# Patient Record
Sex: Female | Born: 1970 | Race: Black or African American | Hispanic: No | Smoking: Never smoker
Health system: Southern US, Community
[De-identification: ages and names within clinical notes are randomized; demographics above are authoritative.]

## PROBLEM LIST (undated history)

## (undated) ENCOUNTER — Ambulatory Visit: Admission: EM | Payer: Self-pay | Source: Home / Self Care

## (undated) DIAGNOSIS — M419 Scoliosis, unspecified: Secondary | ICD-10-CM

## (undated) DIAGNOSIS — I10 Essential (primary) hypertension: Secondary | ICD-10-CM

## (undated) DIAGNOSIS — R011 Cardiac murmur, unspecified: Secondary | ICD-10-CM

## (undated) HISTORY — DX: Scoliosis, unspecified: M41.9

## (undated) HISTORY — PX: OTHER SURGICAL HISTORY: SHX169

## (undated) HISTORY — PX: DILATION AND CURETTAGE OF UTERUS: SHX78

## (undated) HISTORY — DX: Essential (primary) hypertension: I10

## (undated) HISTORY — DX: Cardiac murmur, unspecified: R01.1

---

## 1997-10-07 ENCOUNTER — Other Ambulatory Visit: Admission: RE | Admit: 1997-10-07 | Discharge: 1997-10-07 | Payer: Self-pay | Admitting: Obstetrics & Gynecology

## 1998-04-29 ENCOUNTER — Inpatient Hospital Stay (HOSPITAL_COMMUNITY): Admission: AD | Admit: 1998-04-29 | Discharge: 1998-04-29 | Payer: Self-pay | Admitting: Obstetrics & Gynecology

## 1998-04-29 ENCOUNTER — Encounter: Payer: Self-pay | Admitting: Obstetrics and Gynecology

## 1998-04-30 ENCOUNTER — Inpatient Hospital Stay (HOSPITAL_COMMUNITY): Admission: AD | Admit: 1998-04-30 | Discharge: 1998-05-03 | Payer: Self-pay | Admitting: Obstetrics and Gynecology

## 1998-10-16 ENCOUNTER — Other Ambulatory Visit: Admission: RE | Admit: 1998-10-16 | Discharge: 1998-10-16 | Payer: Self-pay | Admitting: Obstetrics and Gynecology

## 2001-06-29 ENCOUNTER — Emergency Department (HOSPITAL_COMMUNITY): Admission: EM | Admit: 2001-06-29 | Discharge: 2001-06-29 | Payer: Self-pay | Admitting: Emergency Medicine

## 2002-06-22 ENCOUNTER — Emergency Department (HOSPITAL_COMMUNITY): Admission: EM | Admit: 2002-06-22 | Discharge: 2002-06-22 | Payer: Self-pay | Admitting: Emergency Medicine

## 2002-06-22 ENCOUNTER — Encounter: Payer: Self-pay | Admitting: Emergency Medicine

## 2002-10-21 ENCOUNTER — Other Ambulatory Visit: Admission: RE | Admit: 2002-10-21 | Discharge: 2002-10-21 | Payer: Self-pay | Admitting: Obstetrics and Gynecology

## 2003-07-23 ENCOUNTER — Emergency Department (HOSPITAL_COMMUNITY): Admission: EM | Admit: 2003-07-23 | Discharge: 2003-07-24 | Payer: Self-pay | Admitting: Emergency Medicine

## 2004-01-30 ENCOUNTER — Other Ambulatory Visit: Admission: RE | Admit: 2004-01-30 | Discharge: 2004-01-30 | Payer: Self-pay | Admitting: Obstetrics and Gynecology

## 2004-06-01 ENCOUNTER — Encounter: Admission: RE | Admit: 2004-06-01 | Discharge: 2004-06-01 | Payer: Self-pay | Admitting: Family Medicine

## 2005-03-29 ENCOUNTER — Other Ambulatory Visit: Admission: RE | Admit: 2005-03-29 | Discharge: 2005-03-29 | Payer: Self-pay | Admitting: *Deleted

## 2005-04-18 ENCOUNTER — Encounter: Admission: RE | Admit: 2005-04-18 | Discharge: 2005-05-03 | Payer: Self-pay | Admitting: Family Medicine

## 2005-06-13 ENCOUNTER — Emergency Department (HOSPITAL_COMMUNITY): Admission: EM | Admit: 2005-06-13 | Discharge: 2005-06-13 | Payer: Self-pay | Admitting: Internal Medicine

## 2007-04-10 ENCOUNTER — Other Ambulatory Visit: Admission: RE | Admit: 2007-04-10 | Discharge: 2007-04-10 | Payer: Self-pay | Admitting: Family Medicine

## 2008-03-27 ENCOUNTER — Emergency Department (HOSPITAL_COMMUNITY): Admission: EM | Admit: 2008-03-27 | Discharge: 2008-03-27 | Payer: Self-pay | Admitting: Emergency Medicine

## 2010-01-02 ENCOUNTER — Inpatient Hospital Stay (HOSPITAL_COMMUNITY)
Admission: AD | Admit: 2010-01-02 | Discharge: 2010-01-03 | Payer: Self-pay | Source: Home / Self Care | Attending: Obstetrics and Gynecology | Admitting: Obstetrics and Gynecology

## 2010-01-06 ENCOUNTER — Ambulatory Visit (HOSPITAL_COMMUNITY)
Admission: AD | Admit: 2010-01-06 | Discharge: 2010-01-06 | Payer: Self-pay | Source: Home / Self Care | Attending: Obstetrics and Gynecology | Admitting: Obstetrics and Gynecology

## 2010-02-14 ENCOUNTER — Encounter: Payer: Self-pay | Admitting: Family Medicine

## 2010-03-31 IMAGING — CR DG CHEST 2V
2 series · 2 of 2 positions shown · non-contrast
Comparison: Report from 06/22/2002

CLINICAL DATA: Chest pain.  Shortness of breath.

CHEST - 2 VIEW

[w chest pa]
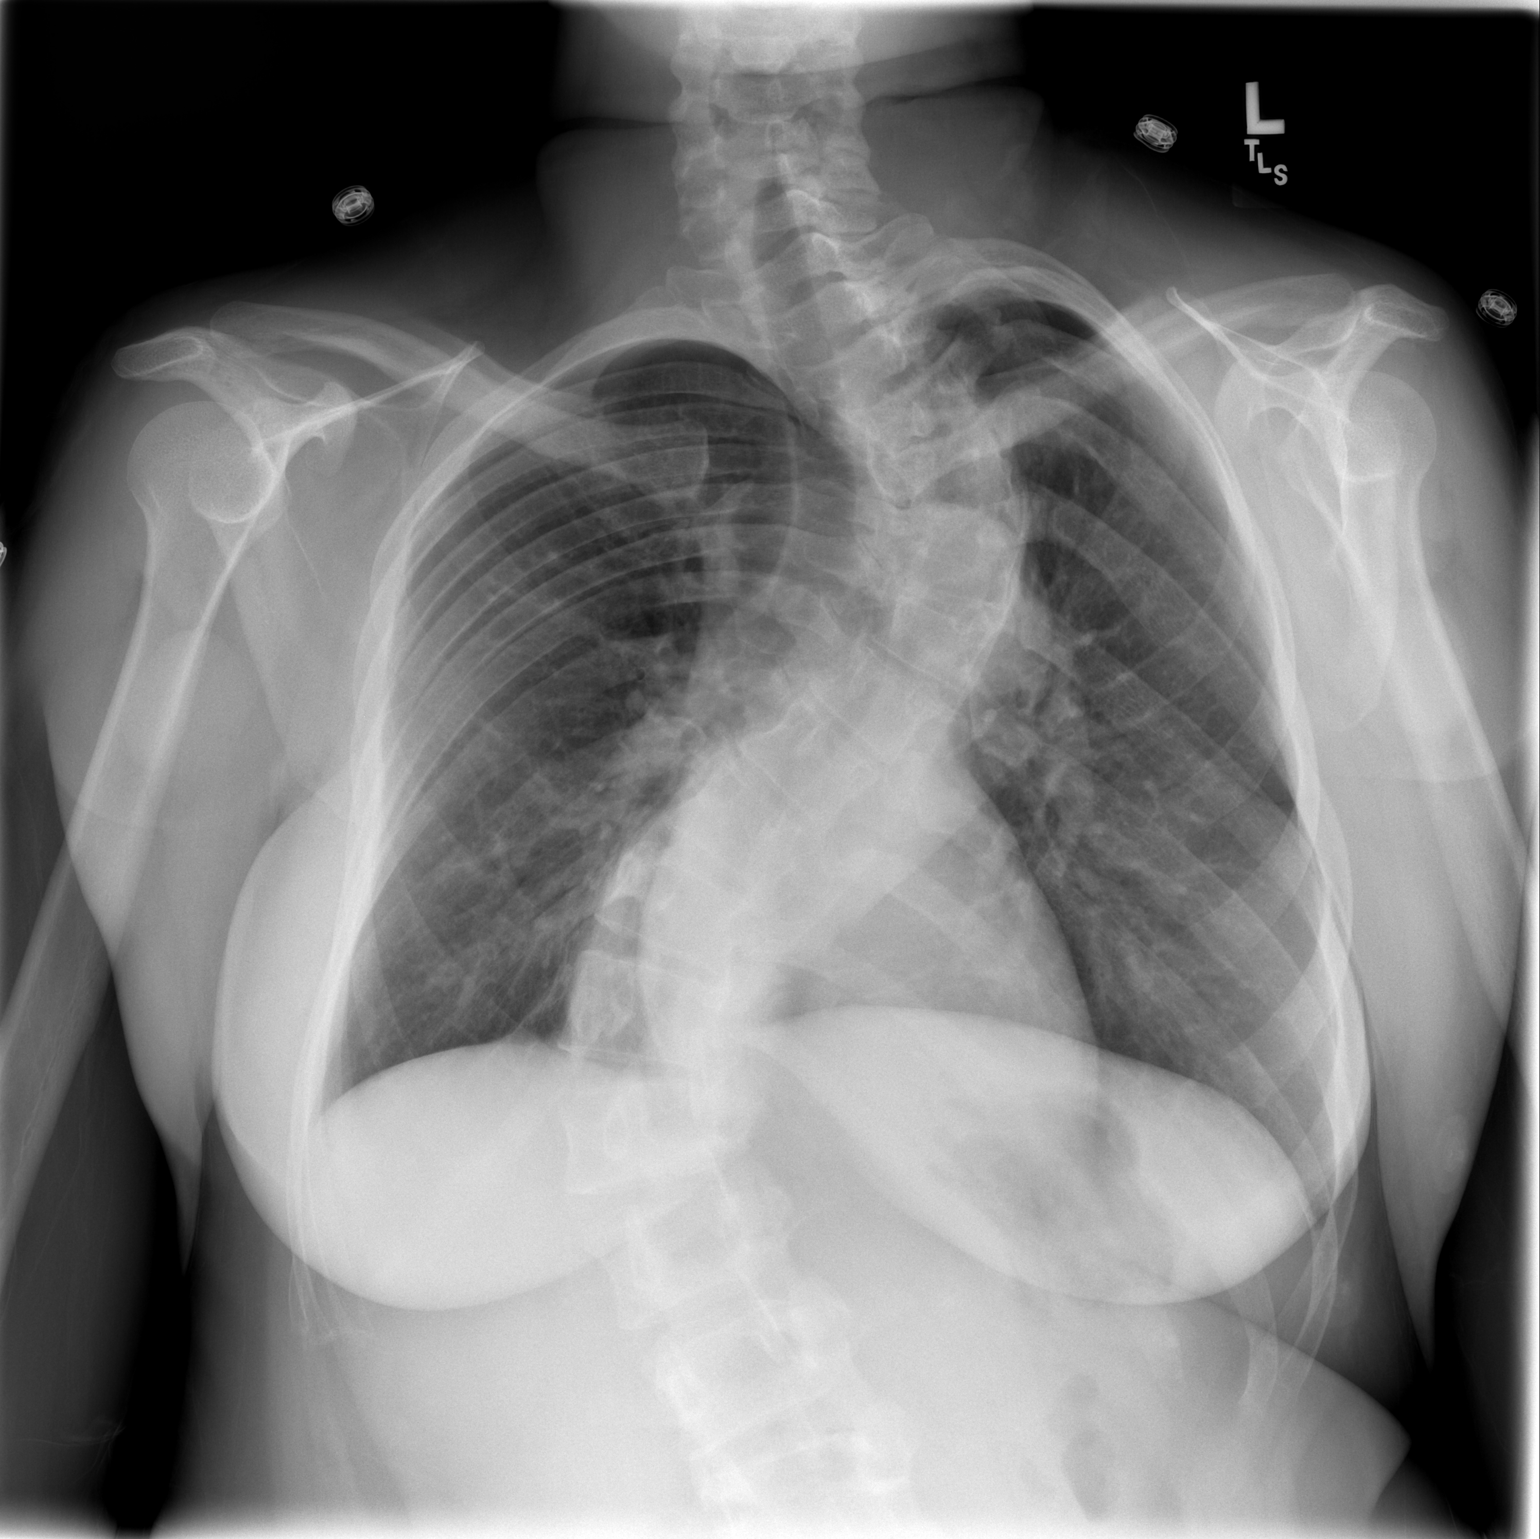

[w chest lat]
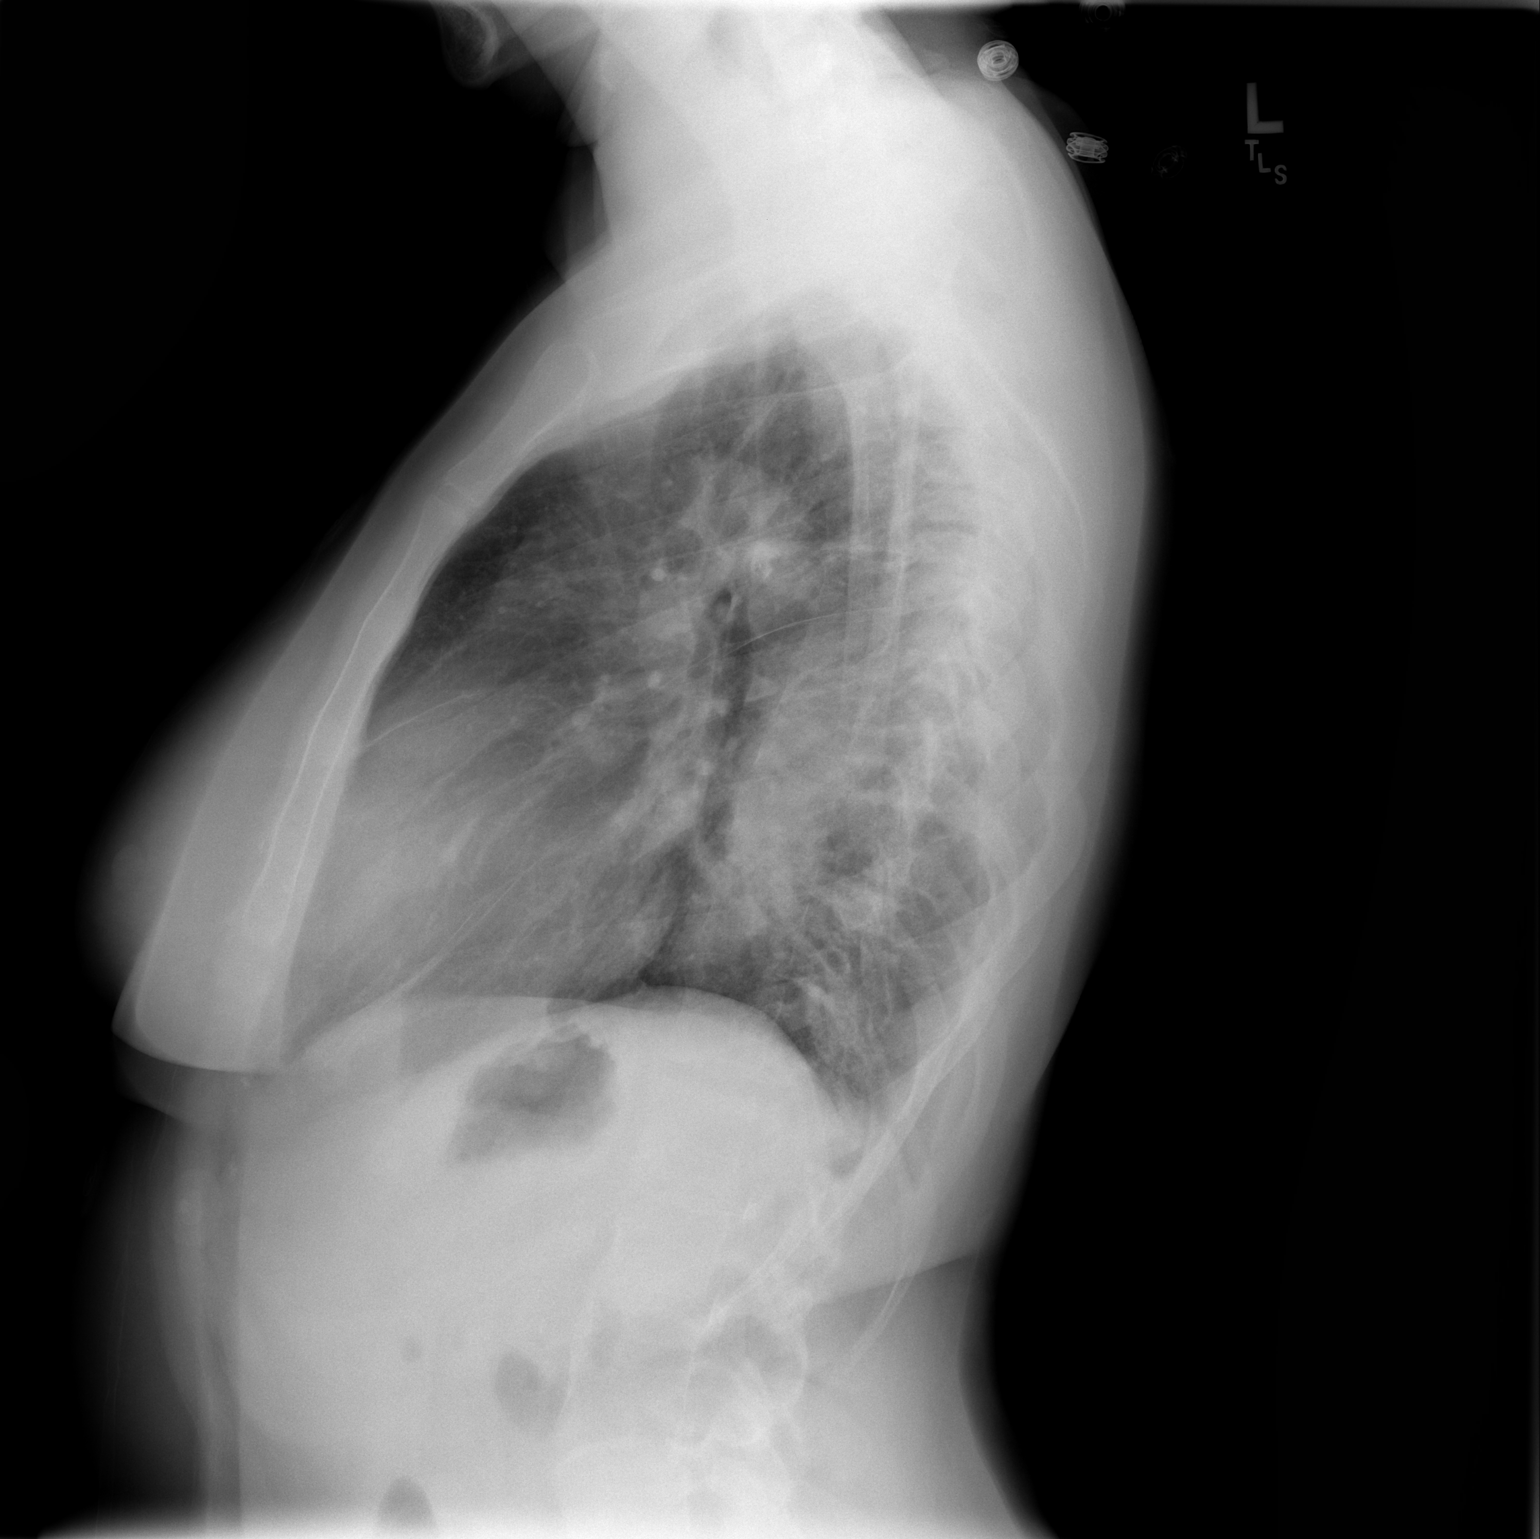

[2 of 2 positions shown; findings below may reference images not displayed]

FINDINGS: There is 74 degrees of levoconvex scoliosis as measured
between the inferior endplate of T3 and the superior endplate of
T9.  There is 65 degrees of dextroconvex thoracic scoliosis between
the inferior endplate of T9 and the superior endplate of L3.  In
the upper thoracic curve there may be associated vertebral anomaly
such as hemivertebra.

On the frontal projection, a double contour along the descending
thoracic aorta is noted.  This could well be related to the
underlying scoliotic deformity, but I cannot exclude atelectasis
medially in the left lower lobe as a potential cause.  The
appearance is more sharply defined than is typically encountered in
the setting of pneumonia.  This could be further evaluated with CT
the chest if clinically warranted.
IMPRESSION: 1.  Prominent S-shaped thoracolumbar scoliosis as noted above.
2.  Double contour along the aortic margin could possibly represent
atelectasis medially in the left lower lobe, but may simply be
related to rotation and the underlying severe scoliosis.  Correlate
with breath sounds and consider CT if clinically warranted.

## 2010-04-06 LAB — URINALYSIS, ROUTINE W REFLEX MICROSCOPIC
Bilirubin Urine: NEGATIVE
Glucose, UA: NEGATIVE mg/dL
Leukocytes, UA: NEGATIVE
Specific Gravity, Urine: >1.03 — ABNORMAL HIGH (ref 1.005–1.030)
pH: 5 (ref 5.0–8.0)

## 2010-04-06 LAB — CBC
HCT: 35.1 % — ABNORMAL LOW (ref 36.0–46.0)
Hemoglobin: 11.8 g/dL — ABNORMAL LOW (ref 12.0–15.0)
RDW: 14.7 % (ref 11.5–15.5)
WBC: 7.4 10*3/uL (ref 4.0–10.5)

## 2010-04-06 LAB — URINE MICROSCOPIC-ADD ON

## 2010-05-06 LAB — CBC
HCT: 33.9 % — ABNORMAL LOW (ref 36.0–46.0)
MCHC: 34.4 g/dL (ref 30.0–36.0)
MCV: 83.1 fL (ref 78.0–100.0)
RBC: 4.08 MIL/uL (ref 3.87–5.11)

## 2010-05-06 LAB — POCT I-STAT, CHEM 8
Creatinine, Ser: 0.8 mg/dL (ref 0.4–1.2)
HCT: 36 % (ref 36.0–46.0)
Hemoglobin: 12.2 g/dL (ref 12.0–15.0)
Potassium: 3.8 mEq/L (ref 3.5–5.1)

## 2010-05-06 LAB — DIFFERENTIAL
Basophils Absolute: 0 10*3/uL (ref 0.0–0.1)
Eosinophils Absolute: 0.1 10*3/uL (ref 0.0–0.7)
Lymphocytes Relative: 29 % (ref 12–46)
Lymphs Abs: 1.5 10*3/uL (ref 0.7–4.0)
Monocytes Absolute: 0.3 10*3/uL (ref 0.1–1.0)
Monocytes Relative: 5 % (ref 3–12)
Neutrophils Relative %: 65 % (ref 43–77)

## 2010-05-06 LAB — D-DIMER, QUANTITATIVE: D-Dimer, Quant: 0.33 ug/mL-FEU (ref 0.00–0.48)

## 2010-05-06 LAB — POCT CARDIAC MARKERS: CKMB, poc: <1 ng/mL — ABNORMAL LOW (ref 1.0–8.0)

## 2011-03-01 ENCOUNTER — Other Ambulatory Visit (HOSPITAL_COMMUNITY)
Admission: RE | Admit: 2011-03-01 | Discharge: 2011-03-01 | Disposition: A | Discharge status: Home / Self Care | Payer: Self-pay | Source: Ambulatory Visit | Attending: Family Medicine | Admitting: Family Medicine

## 2011-03-01 ENCOUNTER — Other Ambulatory Visit: Payer: Self-pay | Admitting: Family Medicine

## 2011-03-01 DIAGNOSIS — Z113 Encounter for screening for infections with a predominantly sexual mode of transmission: Secondary | ICD-10-CM | POA: Insufficient documentation

## 2011-03-01 DIAGNOSIS — Z124 Encounter for screening for malignant neoplasm of cervix: Secondary | ICD-10-CM | POA: Insufficient documentation

## 2011-05-01 ENCOUNTER — Emergency Department (HOSPITAL_COMMUNITY): Payer: Self-pay

## 2011-05-01 ENCOUNTER — Encounter (HOSPITAL_COMMUNITY): Payer: Self-pay | Admitting: *Deleted

## 2011-05-01 ENCOUNTER — Emergency Department (HOSPITAL_COMMUNITY)
Admission: EM | Admit: 2011-05-01 | Discharge: 2011-05-01 | Disposition: A | Discharge status: Home / Self Care | Payer: Self-pay | Attending: Emergency Medicine | Admitting: Emergency Medicine

## 2011-05-01 DIAGNOSIS — J069 Acute upper respiratory infection, unspecified: Secondary | ICD-10-CM | POA: Insufficient documentation

## 2011-05-01 DIAGNOSIS — R05 Cough: Secondary | ICD-10-CM | POA: Insufficient documentation

## 2011-05-01 DIAGNOSIS — H9209 Otalgia, unspecified ear: Secondary | ICD-10-CM | POA: Insufficient documentation

## 2011-05-01 DIAGNOSIS — J3489 Other specified disorders of nose and nasal sinuses: Secondary | ICD-10-CM | POA: Insufficient documentation

## 2011-05-01 DIAGNOSIS — IMO0001 Reserved for inherently not codable concepts without codable children: Secondary | ICD-10-CM | POA: Insufficient documentation

## 2011-05-01 DIAGNOSIS — R509 Fever, unspecified: Secondary | ICD-10-CM | POA: Insufficient documentation

## 2011-05-01 DIAGNOSIS — R059 Cough, unspecified: Secondary | ICD-10-CM | POA: Insufficient documentation

## 2011-05-01 MED ORDER — GUAIFENESIN ER 1200 MG PO TB12: 1.0000 | ORAL_TABLET | Freq: Two times a day (BID) | ORAL | Status: DC

## 2011-05-01 MED ORDER — PROMETHAZINE-DM 6.25-15 MG/5ML PO SYRP: 5.0000 mL | ORAL_SOLUTION | Freq: Four times a day (QID) | ORAL | Status: AC | PRN

## 2011-05-01 MED ORDER — IBUPROFEN 800 MG PO TABS: 800.0000 mg | ORAL_TABLET | Freq: Three times a day (TID) | ORAL | Status: AC | PRN

## 2011-05-01 MED ORDER — ONDANSETRON 8 MG PO TBDP
8.0000 mg | ORAL_TABLET | Freq: Once | ORAL | Status: AC
  Administered 2011-05-01: 8 mg via ORAL
  Filled 2011-05-01: qty 1

## 2011-05-01 MED ORDER — IBUPROFEN 800 MG PO TABS
800.0000 mg | ORAL_TABLET | Freq: Once | ORAL | Status: AC
  Administered 2011-05-01: 800 mg via ORAL
  Filled 2011-05-01: qty 1

## 2011-05-01 NOTE — ED Notes (Signed)
Pt reports sick since Monday with similar symptoms to what her daughter had. Pt has fever, chills, ear pain and shortness of breath.  Pt also reports non-productive cough.  Pt denies having flu shot this year.  Pt reports fevers of up to 104.  Pt denies history of seasonal allergies or asthma.

## 2011-05-01 NOTE — Discharge Instructions (Signed)
Increase your fluids. Return here as needed. Rest as much as possible.

## 2011-05-01 NOTE — ED Provider Notes (Signed)
History     CSN: [id number]  Arrival date & time 05/01/11  1247   First MD Initiated Contact with Patient 05/01/11 1458      Chief Complaint  Patient presents with  . Cough  . Otalgia  . Shortness of Breath  . Facial Pain    (Consider location/radiation/quality/duration/timing/severity/associated sxs/prior treatment) HPI Patient presents emergency Dept. with a one-week history of upper respiratory symptoms.  She, states she has had fever, chills, ear pain, cough, and nasal congestion.  She denies chest pain, abdominal pain, headache, visual changes, stiff neck, back, pain, or urinary symptoms.  She, states she has tried over-the-counter Coricidin and ibuprofen with minimal relief.  She also, states she has had body aches as well.  She states, that she has been resting and increasing her fluid intake. History reviewed. No pertinent past medical history.  Past Surgical History  Procedure Date  . Cesarean section     No family history on file.  History  Substance Use Topics  . Smoking status: Never Smoker   . Smokeless tobacco: Not on file  . Alcohol Use: No    OB History    Grav Para Term Preterm Abortions TAB SAB Ect Mult Living                  Review of Systems All pertinent positives and negatives reviewed in the history of present illness  Allergies  Review of patient's allergies indicates no known allergies.  Home Medications   Current Outpatient Rx  Name Route Sig Dispense Refill  . ACETAMINOPHEN 500 MG PO TABS Oral Take 500 mg by mouth every 6 (six) hours as needed. For pain    . VICKS VAPORUB 4.73-1.2-2.6 % EX OINT Apply externally Apply 1 application topically as needed. For cold and flu    . CHLORPHENIRAMINE-ACETAMINOPHEN 2-325 MG PO TABS Oral Take 1 tablet by mouth every 6 (six) hours as needed. For cold and flu    . IBUPROFEN 200 MG PO TABS Oral Take 400 mg by mouth every 6 (six) hours as needed. For pain    . PSEUDOEPH-DOXYLAMINE-DM-APAP  60-12.06-22-998 MG/30ML PO LIQD Oral Take 15 mLs by mouth every 6 (six) hours as needed. For cold and flu    . GUAIFENESIN ER 1200 MG PO TB12 Oral Take 1 tablet (1,200 mg total) by mouth 2 (two) times daily. 20 each 0  . IBUPROFEN 800 MG PO TABS Oral Take 1 tablet (800 mg total) by mouth every 8 (eight) hours as needed for pain. 21 tablet 0  . PROMETHAZINE-DM 6.25-15 MG/5ML PO SYRP Oral Take 5-10 mLs by mouth 4 (four) times daily as needed for cough. 120 mL 0    BP 118/81  Pulse 88  Temp(Src) 98.1 F (36.7 C) (Axillary)  Resp 16  SpO2 100%  LMP 04/29/2011  Physical Exam  Constitutional: She is oriented to person, place, and time. She appears well-developed and well-nourished. No distress.  HENT:  Head: Normocephalic and atraumatic.  Mouth/Throat: Oropharynx is clear and moist. No oropharyngeal exudate.  Eyes: Conjunctivae are normal. Pupils are equal, round, and reactive to light.  Neck: Normal range of motion. Neck supple. No rigidity. Normal range of motion present.  Cardiovascular: Normal rate, regular rhythm and normal heart sounds.  Exam reveals no gallop and no friction rub.   No murmur heard. Pulmonary/Chest: Effort normal and breath sounds normal. No respiratory distress. She has no wheezes. She has no rales.  Lymphadenopathy:    She has no cervical  adenopathy.  Neurological: She is alert and oriented to person, place, and time.  Skin: Skin is warm and dry. No rash noted.    ED Course  Procedures (including critical care time)  Labs Reviewed - No data to display Dg Chest 2 View  05/01/2011  *RADIOLOGY REPORT*  Clinical Data: Fever and cough.  Mild SOB  CHEST - 2 VIEW  Comparison: 03/27/2008  Findings: The heart size and mediastinal contours are within normal limits.  Both lungs are clear. There is a prominent scoliosis deformity involving the thoracic and lumbar spine.  IMPRESSION: Negative exam.  Original Report Authenticated By: Rosealee Albee, M.D.     1. Viral URI  with cough    Patient has a viral URI  Based on her HPI  and physical exam findings, along with her x-ray results.  Patient is not showing any signs of respiratory distress.  Emergency room.  Her pulse oximetry is normal.  Patient advised increased fluid intake and rest as much possible.  She still to return here for any worsening in her condition.    MDM  See above        Carlyle Dolly, PA-C 05/01/11 2051

## 2011-05-02 NOTE — ED Provider Notes (Signed)
Medical screening examination/treatment/procedure(s) were performed by non-physician practitioner and as supervising physician I was immediately available for consultation/collaboration.  Doug Sou, MD 05/02/11 [phone]

## 2011-09-29 ENCOUNTER — Emergency Department (HOSPITAL_BASED_OUTPATIENT_CLINIC_OR_DEPARTMENT_OTHER)
Admission: EM | Admit: 2011-09-29 | Discharge: 2011-09-30 | Disposition: A | Discharge status: Home / Self Care | Payer: Self-pay | Attending: Emergency Medicine | Admitting: Emergency Medicine

## 2011-09-29 ENCOUNTER — Encounter (HOSPITAL_BASED_OUTPATIENT_CLINIC_OR_DEPARTMENT_OTHER): Payer: Self-pay | Admitting: *Deleted

## 2011-09-29 ENCOUNTER — Emergency Department (HOSPITAL_BASED_OUTPATIENT_CLINIC_OR_DEPARTMENT_OTHER): Payer: Self-pay

## 2011-09-29 DIAGNOSIS — M25469 Effusion, unspecified knee: Secondary | ICD-10-CM | POA: Insufficient documentation

## 2011-09-29 DIAGNOSIS — M25461 Effusion, right knee: Secondary | ICD-10-CM

## 2011-09-29 MED ORDER — OXYCODONE-ACETAMINOPHEN 5-325 MG PO TABS
2.0000 | ORAL_TABLET | Freq: Once | ORAL | Status: AC
  Administered 2011-09-30: 2 via ORAL
  Filled 2011-09-29 (×2): qty 2

## 2011-09-29 NOTE — ED Notes (Signed)
C/o right knee pain, swelling, and giving out on her for 2 months. Pt denies injury.

## 2011-09-29 NOTE — ED Provider Notes (Signed)
History     CSN: [id number]  Arrival date & time 09/29/11  2248   First MD Initiated Contact with Patient 09/29/11 2337      Chief Complaint  Patient presents with  . Knee Pain    (Consider location/radiation/quality/duration/timing/severity/associated sxs/prior treatment) HPI Pain and swelling right knee for two months. Worse now for several days.  No known injury.  Patient is hairdresser and on feet all day.  She has not had direct trauma, redness, or fever.  No history of gout.  Patient has not had similar problems in past.  She is using ibuprofen and heat and ice at home without relief.  History reviewed. No pertinent past medical history.  Past Surgical History  Procedure Date  . Cesarean section     History reviewed. No pertinent family history.  History  Substance Use Topics  . Smoking status: Never Smoker   . Smokeless tobacco: Not on file  . Alcohol Use: No    OB History    Grav Para Term Preterm Abortions TAB SAB Ect Mult Living                  Review of Systems  Constitutional: Negative for fever, chills, activity change, appetite change and unexpected weight change.  HENT: Negative for sore throat, rhinorrhea, neck pain, neck stiffness and sinus pressure.   Eyes: Negative for visual disturbance.  Respiratory: Negative for cough and shortness of breath.   Cardiovascular: Negative for chest pain and leg swelling.  Gastrointestinal: Negative for vomiting, abdominal pain, diarrhea and blood in stool.  Genitourinary: Negative for dysuria, urgency, frequency, vaginal discharge and difficulty urinating.  Musculoskeletal: Positive for joint swelling and arthralgias. Negative for myalgias and gait problem.  Skin: Negative for color change and rash.  Neurological: Negative for weakness, light-headedness and headaches.  Hematological: Does not bruise/bleed easily.  Psychiatric/Behavioral: Negative for dysphoric mood.    Allergies  Review of patient's allergies  indicates no known allergies.  Home Medications   Current Outpatient Rx  Name Route Sig Dispense Refill  . IBUPROFEN 200 MG PO TABS Oral Take 800 mg by mouth every 6 (six) hours as needed. For pain      BP 131/77  Pulse 78  Temp 98.5 F (36.9 C) (Oral)  Resp 14  Ht 5\' 7"  (1.702 m)  Wt 171 lb (77.565 kg)  BMI 26.78 kg/m2  SpO2 100%  Physical Exam  Nursing note and vitals reviewed. Constitutional: She appears well-developed and well-nourished.  HENT:  Head: Normocephalic.  Eyes: Pupils are equal, round, and reactive to light.  Neck: Normal range of motion.  Cardiovascular: Normal rate and regular rhythm.   Pulmonary/Chest: Effort normal.  Musculoskeletal:       Right knee with swelling and tenderness to right knee diffusely with effusion.  DP right foot intact with sensation intact.  No erythema or warmth.   Neurological: She is alert.  Skin: Skin is warm and dry.  Psychiatric: She has a normal mood and affect.    ED Course  ARTHOCENTESIS Date/Time: 09/30/2011 12:39 AM Performed by: Hilario Quarry Authorized by: Hilario Quarry Consent: Verbal consent obtained. Written consent obtained. Risks and benefits: risks, benefits and alternatives were discussed Consent given by: patient Patient identity confirmed: verbally with patient and arm band Time out: Immediately prior to procedure a "time out" was called to verify the correct patient, procedure, equipment, support staff and site/side marked as required. Indications: joint swelling and pain  Body area: knee Joint: right  knee Local anesthesia used: yes Anesthesia: local infiltration Local anesthetic: lidocaine 1% with epinephrine Anesthetic total: 5 ml Patient sedated: no Preparation: Patient was prepped and draped in the usual sterile fashion. Needle gauge: 22 G Approach: lateral Aspirate: blood-tinged and serous Aspirate amount: 35 ml Patient tolerance: Patient tolerated the procedure well with no immediate  complications.   (including critical care time)  Labs Reviewed - No data to display No results found.   No diagnosis found.  Dg Knee Complete 4 Views Right  09/29/2011  *RADIOLOGY REPORT*  Clinical Data: Right knee pain and swelling for weeks with difficulty bearing weight.  No known injury.  RIGHT KNEE - COMPLETE 4+ VIEW  Comparison: None.  Findings: There is a moderate sized knee joint effusion.  The mineralization and alignment are normal.  There is no evidence of acute fracture, dislocation or significant joint space loss.  No erosive changes are seen.  IMPRESSION: Moderate sized knee joint effusion.  No acute osseous or significant arthropathic changes.   Original Report Authenticated By: Gerrianne Scale, M.D.     Patient to have knee immobilizer placed and crutches.  Instructed to ice and elevate.  Patient referred to follow up with Dr. Pearletha Forge.       Hilario Quarry, MD 09/30/11 [phone]

## 2011-09-30 MED ORDER — OXYCODONE-ACETAMINOPHEN 5-325 MG PO TABS: 1.0000 | ORAL_TABLET | ORAL | Status: AC | PRN

## 2011-09-30 MED ORDER — LIDOCAINE-EPINEPHRINE 2 %-1:100000 IJ SOLN
INTRAMUSCULAR | Status: AC
  Filled 2011-09-30: qty 1

## 2011-10-03 ENCOUNTER — Ambulatory Visit (INDEPENDENT_AMBULATORY_CARE_PROVIDER_SITE_OTHER): Payer: Self-pay | Admitting: Family Medicine

## 2011-10-03 ENCOUNTER — Encounter: Payer: Self-pay | Admitting: Family Medicine

## 2011-10-03 VITALS — BP 123/87 | HR 76 | Ht 67.0 in | Wt 172.0 lb

## 2011-10-03 DIAGNOSIS — M25569 Pain in unspecified knee: Secondary | ICD-10-CM

## 2011-10-03 DIAGNOSIS — M25561 Pain in right knee: Secondary | ICD-10-CM

## 2011-10-03 MED ORDER — DICLOFENAC SODIUM 75 MG PO TBEC: 75.0000 mg | DELAYED_RELEASE_TABLET | Freq: Two times a day (BID) | ORAL | Status: AC

## 2011-10-03 NOTE — Patient Instructions (Addendum)
Your knee pain is most likely due to a meniscal tear, less likely patellar subluxation. Fill out the paperwork for [facility] coverage which would help cover visits, physical therapy, imaging. Ice area 15 minutes at a time 3-4 times a day. Elevate above the level of your heart when possible. ACE wrap for compression to keep swelling down. Voltaren twice a day with food for pain and inflammation. Ok to take tylenol 2 tabs three times a day in addition to the voltaren. IF you take percocet, do not take tylenol (percocet has tylenol in it). If you want to go ahead with injection or stronger pain medication call me. Follow up with me in 2 weeks for reevaluation.

## 2011-10-04 ENCOUNTER — Encounter: Payer: Self-pay | Admitting: Family Medicine

## 2011-10-04 DIAGNOSIS — M25561 Pain in right knee: Secondary | ICD-10-CM | POA: Insufficient documentation

## 2011-10-04 NOTE — Assessment & Plan Note (Signed)
Radiographs with effusion but no arthritis or other bony abnormalities.  Patient's history and exam suggestive of either medial meniscal tear or patellar subluxation (less likely).  Going to fill out [facility] Coverage paperwork that would allow [place] to consider PT, MRI of knee.  Icing, elevation, ace wrap.  Voltaren twice a day with food.  Will consider intraarticular injection.  F/u in 2 weeks for reevaluation.

## 2011-10-04 NOTE — Progress Notes (Signed)
  Subjective:    Patient ID: Sonya Aguilar, female    DOB: 25-Jun-1970, 41 y.o.   MRN: [id number]  PCP: [facility]  HPI 41 yo F here for right knee pain.  Patient denies known injury or trauma. 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 Has had pain within right knee for years but more recently started giving out, feeling like knee was 'popping out of place.' Over a week ago had fairly significant swelling - improved with ice/heat, otc ibuprofen. Then worsened again Thursday causing her to come to ED - had 35mL blood tinged serous fluid aspirated from her knee - only mild improvement in pain though motion has improved. Feeling of catching within right knee and instability.  No true locking. When she has these popping out sensations they last for seconds, she stops and knee feels better.  History reviewed. No pertinent past medical history.  Current Outpatient Prescriptions on File Prior to Visit  Medication Sig Dispense Refill  . ibuprofen (ADVIL,MOTRIN) 200 MG tablet Take 800 mg by mouth every 6 (six) hours as needed. For pain      . oxyCODONE-acetaminophen (PERCOCET/ROXICET) 5-325 MG per tablet Take 1 tablet by mouth every 4 (four) hours as needed for pain.  6 tablet  0    Past Surgical History  Procedure Date  . Cesarean section     No Known Allergies  History   Social History  . Marital Status: Married    Spouse Name: N/A    Number of Children: N/A  . Years of Education: N/A   Occupational History  . Not on file.   Social History Main Topics  . Smoking status: Never Smoker   . Smokeless tobacco: Not on file  . Alcohol Use: No  . Drug Use: No  . Sexually Active: Not on file   Other Topics Concern  . Not on file   Social History Narrative  . No narrative on file    Family History  Problem Relation Age of Onset  . Hypertension Mother   . Hypertension Father   . Diabetes Father     BP 123/87  Pulse 76  Ht 5\' 7"  (1.702 m)  Wt 172 lb  (78.019 kg)  BMI 26.94 kg/m2  Review of Systems See HPI above.    Objective:   Physical Exam Gen: NAD  R knee: Mild effusion.  No gross deformity, ecchymoses. TTP greatest medial joint line and post patellar facets.  No lateral joint line TTP. ROM 0 - 110 degrees. Negative ant/post drawers. Negative valgus/varus testing. Negative lachmanns. Mild pain medially with mcmurrays, apleys, Pain with patellar apprehension. NV intact distally.  L knee: FROM without pain, swelling, instability.    Assessment & Plan:  1. Right knee pain - Radiographs with effusion but no arthritis or other bony abnormalities.  Patient's history and exam suggestive of either medial meniscal tear or patellar subluxation (less likely).  Going to fill out [facility] Coverage paperwork that would allow [place] to consider PT, MRI of knee.  Icing, elevation, ace wrap.  Voltaren twice a day with food.  Will consider intraarticular injection.  F/u in 2 weeks for reevaluation.

## 2011-10-17 ENCOUNTER — Ambulatory Visit (INDEPENDENT_AMBULATORY_CARE_PROVIDER_SITE_OTHER): Payer: Self-pay | Admitting: Family Medicine

## 2011-10-17 ENCOUNTER — Encounter: Payer: Self-pay | Admitting: Family Medicine

## 2011-10-17 VITALS — BP 122/78 | HR 84 | Ht 67.0 in | Wt 173.0 lb

## 2011-10-17 DIAGNOSIS — M25569 Pain in unspecified knee: Secondary | ICD-10-CM

## 2011-10-17 DIAGNOSIS — M25561 Pain in right knee: Secondary | ICD-10-CM

## 2011-10-17 NOTE — Progress Notes (Signed)
Subjective:    Patient ID: Sonya Aguilar, female    DOB: 1970/11/16, 41 y.o.   MRN: [id number]  PCP: [facility]  HPI  41 yo F here for f/u right knee pain.  9/9: Patient denies known injury or trauma. 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 Has had pain within right knee for years but more recently started giving out, feeling like knee was 'popping out of place.' Over a week ago had fairly significant swelling - improved with ice/heat, otc ibuprofen. Then worsened again Thursday causing her to come to ED - had 35mL blood tinged serous fluid aspirated from her knee - only mild improvement in pain though motion has improved. Feeling of catching within right knee and instability.  No true locking. When she has these popping out sensations they last for seconds, she stops and knee feels better.  9/23: Patient reports she's about 75% better from initial visit. Still struggles with medial and posterior pain, swelling especially at end of day. Has been icing, taking voltaren Using a knee brace. Still buckling and throbbing at times. Reports she does not qualify for [facility] coverage but uncertain if [facility] has received her paperwork.  History reviewed. No pertinent past medical history.  Current Outpatient Prescriptions on File Prior to Visit  Medication Sig Dispense Refill  . diclofenac (VOLTAREN) 75 MG EC tablet Take 1 tablet (75 mg total) by mouth 2 (two) times daily with a meal.  60 tablet  1    Past Surgical History  Procedure Date  . Cesarean section     No Known Allergies  History   Social History  . Marital Status: Married    Spouse Name: N/A    Number of Children: N/A  . Years of Education: N/A   Occupational History  . Not on file.   Social History Main Topics  . Smoking status: Never Smoker   . Smokeless tobacco: Not on file  . Alcohol Use: No  . Drug Use: No  . Sexually Active: Not on file   Other Topics Concern  . Not on file    Social History Narrative  . No narrative on file    Family History  Problem Relation Age of Onset  . Hypertension Mother   . Hypertension Father   . Diabetes Father     BP 122/78  Pulse 84  Ht 5\' 7"  (1.702 m)  Wt 173 lb (78.472 kg)  BMI 27.10 kg/m2  Review of Systems  See HPI above.    Objective:   Physical Exam  Gen: NAD  R knee: Minimal effusion.  No gross deformity, ecchymoses. TTP greatest medial joint line and post patellar facets.  No lateral joint line TTP. ROM 0 - 110 degrees. Negative ant/post drawers. Negative valgus/varus testing. Negative lachmanns. Mild pain medially with mcmurrays, apleys.  Negative patellar apprehension. NV intact distally.  L knee: FROM without pain, swelling, instability.    Assessment & Plan:  1. Right knee pain - Radiographs with effusion but no arthritis or other bony abnormalities.  Has improved since last visit but still having trouble with throbbing and feeling of instability.  Exam today consistent with meniscal tear.  She would like to try cortisone injection which was given today.  Continue voltaren, icing, elevation, knee brace.  F/u in 1 month.  She is going to apply for medicaid and financial assistance through PT and can consider physical therapy.  After informed written consent, patient was seated on exam table.  Right knee was prepped with alcohol swab and utilizing anterolateral approach, patient's right knee was injected intraarticularly with 3:1 marcaine: depomedrol. Patient tolerated the procedure well without immediate complications.

## 2011-10-17 NOTE — Assessment & Plan Note (Signed)
Radiographs with effusion but no arthritis or other bony abnormalities.  Has improved since last visit but still having trouble with throbbing and feeling of instability.  Exam today consistent with meniscal tear.  She would like to try cortisone injection which was given today.  Continue voltaren, icing, elevation, knee brace.  F/u in 1 month.  She is going to apply for medicaid and financial assistance through PT and can consider physical therapy.  After informed written consent, patient was seated on exam table. Right knee was prepped with alcohol swab and utilizing anterolateral approach, patient's right knee was injected intraarticularly with 3:1 marcaine: depomedrol. Patient tolerated the procedure well without immediate complications.

## 2012-01-07 IMAGING — US US OB TRANSVAGINAL MODIFY
1 series · 13 of 28 positions shown · non-contrast
Comparison: None

CLINICAL DATA: Pregnant, cramping, vaginal bleeding; no
quantitative beta HCG available for correlation

OBSTETRIC <14 WK US AND TRANSVAGINAL OB US
TECHNIQUE: Both transabdominal and transvaginal ultrasound
examinations were performed for complete evaluation of the
gestation as well as the maternal uterus, adnexal regions, and
pelvic cul-de-sac.  Transvaginal technique was performed to assess
early pregnancy.

[Series 1: us ob comp less 14 wks · 0.17mm/px · 79 acquisitions, 13 frames shown]
[im 3/79]
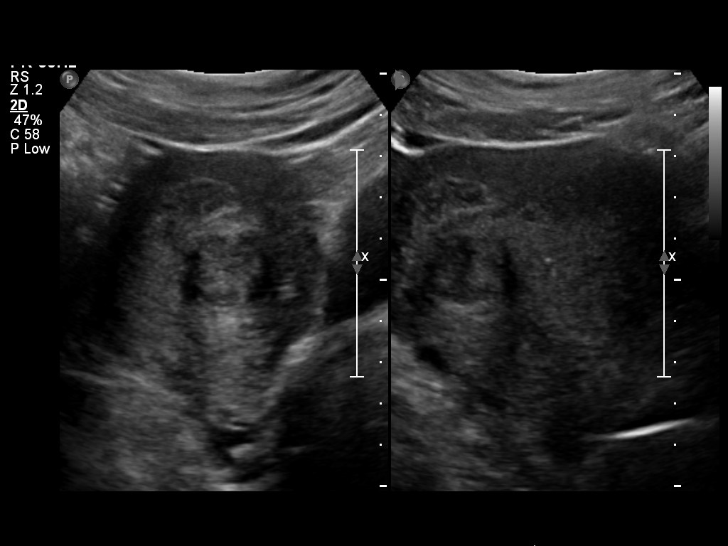
[im 9/79]
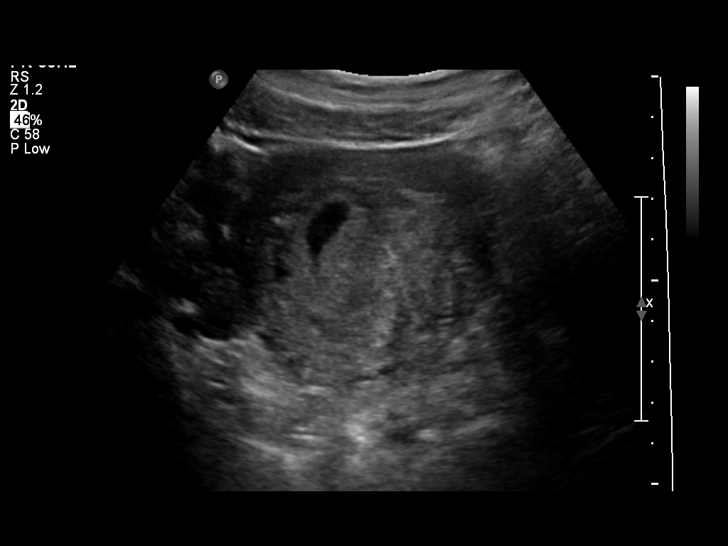
[im 15/79]
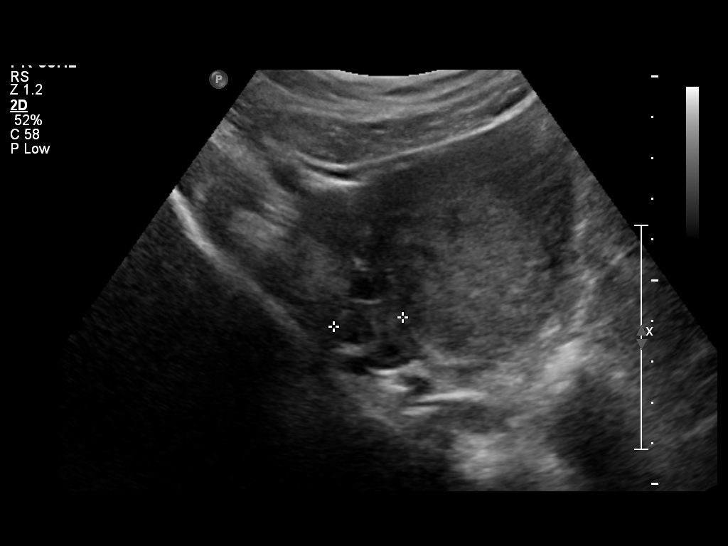
[im 21/79]
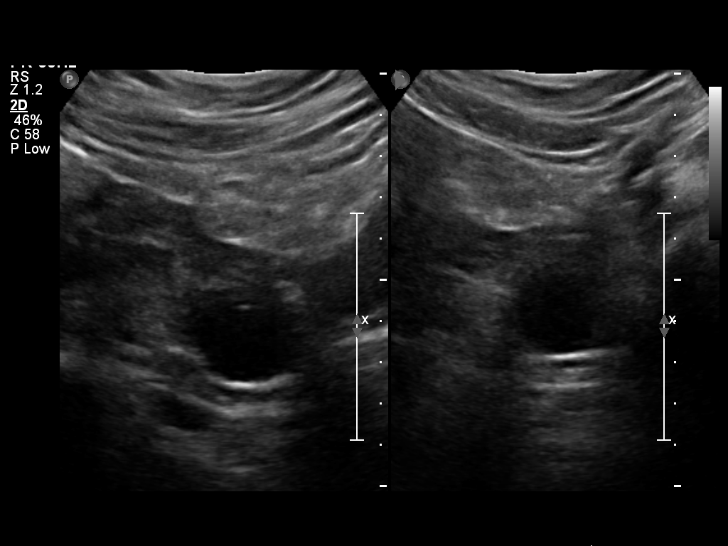
[im 27/79]
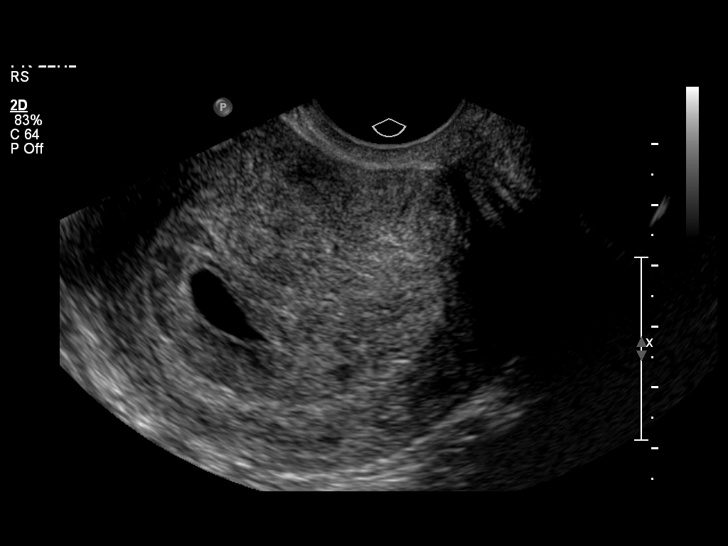
[im 32/79]
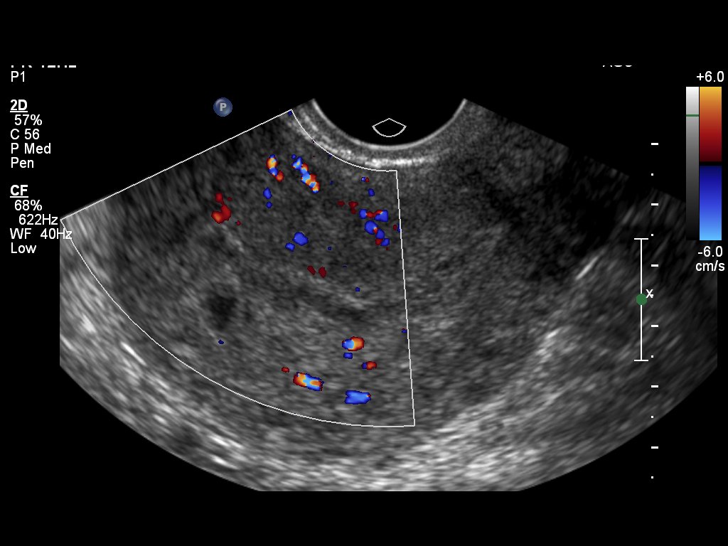
[im 41/79]
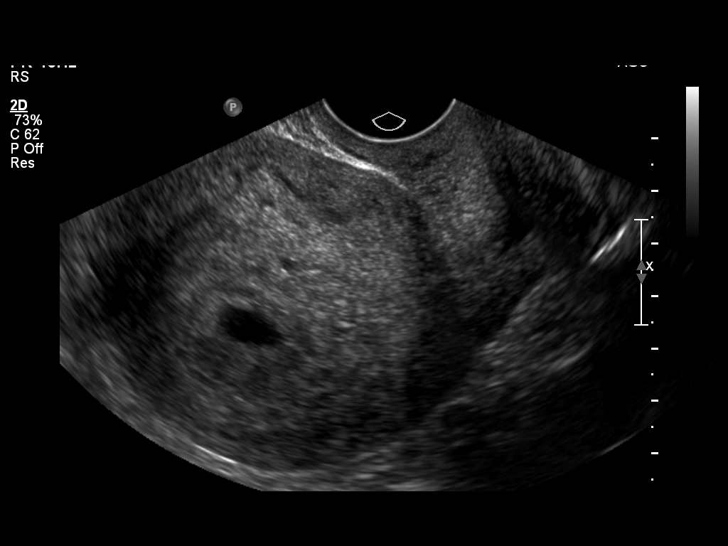
[im 47/79]
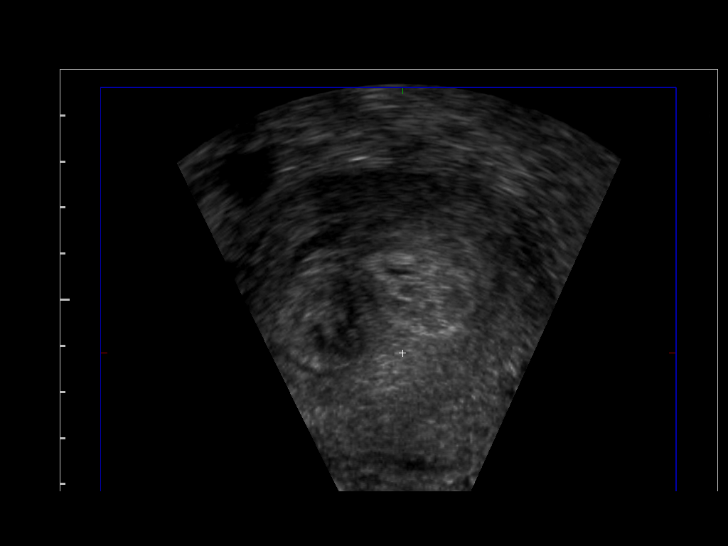
[im 53/79]
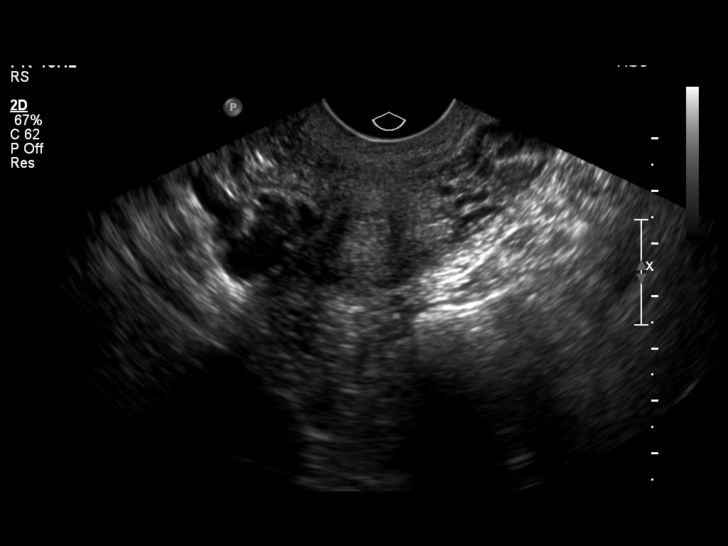
[im 58/79]
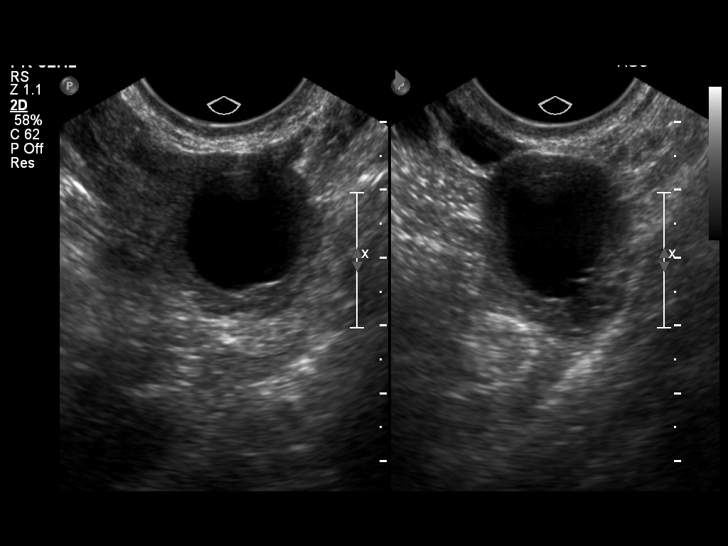
[im 64/79]
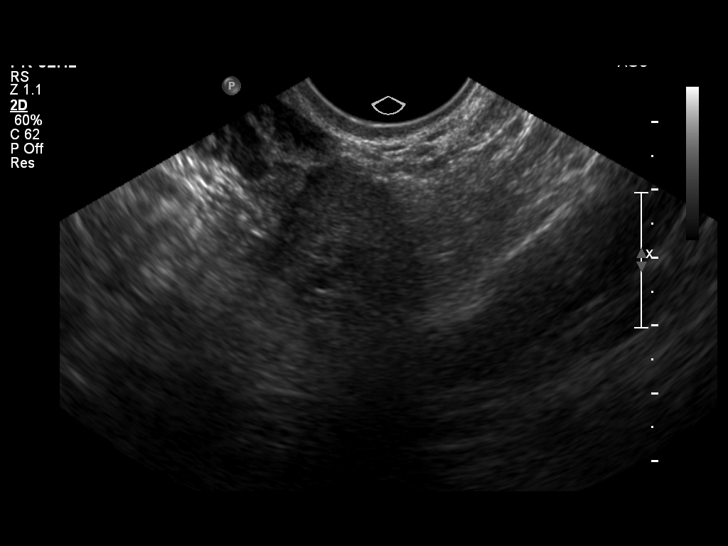
[im 70/79]
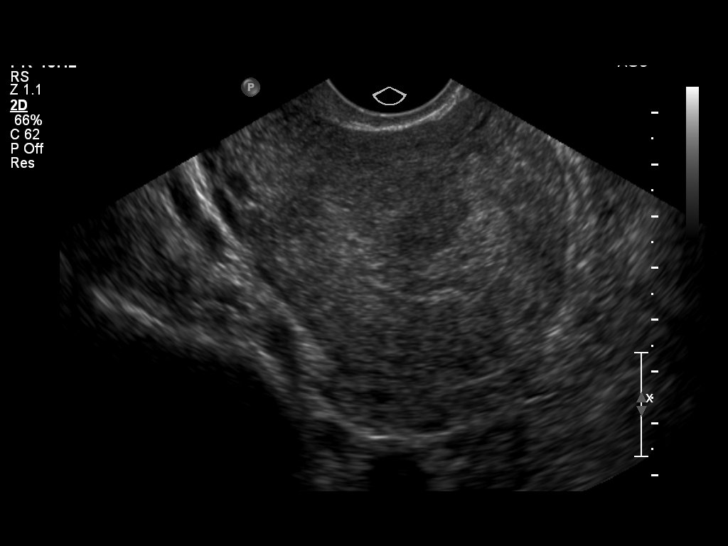
[im 76/79]
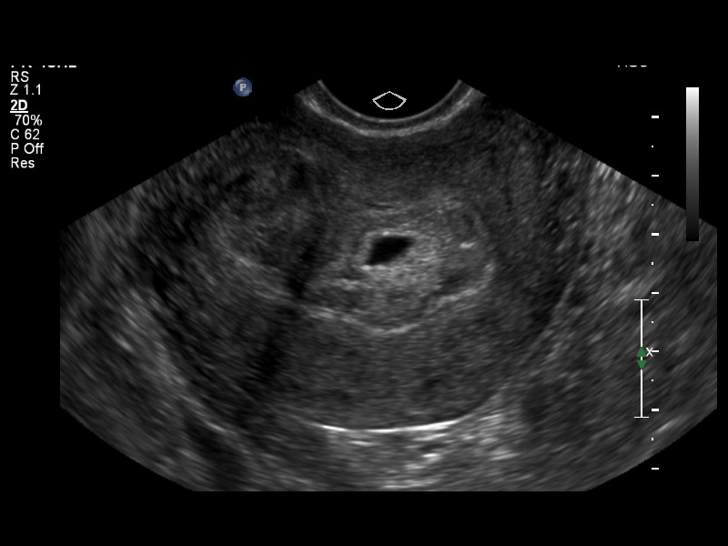

[13 of 28 positions shown; findings below may reference images not displayed]

Intrauterine gestational sac:  Present, irregular/elongated
Yolk sac: Not identified
Embryo: Not identified
Cardiac Activity: N/A
Heart Rate: N/A bpm

MSD: 12.4 mm    6 w   0 d     US EDC:  08/29/2010

Maternal uterus/adnexae:
Small subchorionic hemorrhage.
Right side uterine leiomyoma, submucosal, 2.2 x 2.4 x 2.4 cm.
Right ovary normal size and morphology, 3.2 x 1.4 x 1.5 cm.
Left ovary measures 3.0 x 2.4 by 2.3 cm and contains a 1.8 x 1.8 x
1.5 cm diameter corpus luteal cyst.
No free pelvic fluid.
IMPRESSION: Small gestational sac identified within uterus, somewhat irregular
and elongated, mean sac diameter corresponding to 6 weeks 0 days
EGA.
No definite fetal pole or yolk sac are identified to confirm
viability.
Small subchorionic hemorrhage.
Small submucosal uterine fibroid 2.4 cm greatest size.
Recommend follow-up ultrasound in 10-14 days to reassess for
viability of pregnancy.

## 2012-07-02 ENCOUNTER — Other Ambulatory Visit (HOSPITAL_COMMUNITY): Payer: Self-pay | Admitting: Family Medicine

## 2012-07-02 DIAGNOSIS — Z1231 Encounter for screening mammogram for malignant neoplasm of breast: Secondary | ICD-10-CM

## 2012-07-09 ENCOUNTER — Ambulatory Visit (HOSPITAL_COMMUNITY)
Admission: RE | Admit: 2012-07-09 | Discharge: 2012-07-09 | Disposition: A | Discharge status: Home / Self Care | Payer: Self-pay | Source: Ambulatory Visit | Attending: Family Medicine | Admitting: Family Medicine

## 2012-07-09 DIAGNOSIS — Z1231 Encounter for screening mammogram for malignant neoplasm of breast: Secondary | ICD-10-CM

## 2012-10-19 ENCOUNTER — Emergency Department (HOSPITAL_COMMUNITY)
Admission: EM | Admit: 2012-10-19 | Discharge: 2012-10-19 | Disposition: A | Discharge status: Home / Self Care | Payer: Self-pay | Attending: Emergency Medicine | Admitting: Emergency Medicine

## 2012-10-19 ENCOUNTER — Encounter (HOSPITAL_COMMUNITY): Payer: Self-pay | Admitting: *Deleted

## 2012-10-19 DIAGNOSIS — K089 Disorder of teeth and supporting structures, unspecified: Secondary | ICD-10-CM | POA: Insufficient documentation

## 2012-10-19 DIAGNOSIS — K0889 Other specified disorders of teeth and supporting structures: Secondary | ICD-10-CM

## 2012-10-19 DIAGNOSIS — R6883 Chills (without fever): Secondary | ICD-10-CM | POA: Insufficient documentation

## 2012-10-19 DIAGNOSIS — K002 Abnormalities of size and form of teeth: Secondary | ICD-10-CM | POA: Insufficient documentation

## 2012-10-19 DIAGNOSIS — K029 Dental caries, unspecified: Secondary | ICD-10-CM | POA: Insufficient documentation

## 2012-10-19 MED ORDER — KETOROLAC TROMETHAMINE 60 MG/2ML IM SOLN
60.0000 mg | Freq: Once | INTRAMUSCULAR | Status: AC
  Administered 2012-10-19: 60 mg via INTRAMUSCULAR
  Filled 2012-10-19: qty 2

## 2012-10-19 MED ORDER — HYDROCODONE-ACETAMINOPHEN 5-325 MG PO TABS: 1.0000 | ORAL_TABLET | ORAL | Status: DC | PRN

## 2012-10-19 MED ORDER — PENICILLIN V POTASSIUM 500 MG PO TABS: 500.0000 mg | ORAL_TABLET | Freq: Four times a day (QID) | ORAL | Status: DC

## 2012-10-19 NOTE — ED Provider Notes (Signed)
This chart was scribed for Sonya Aguilar, a non-physician practitioner working with No att. providers found by Lewanda Rife, ED Scribe. This patient was seen in room WTR5/WTR5 and the patient's care was started at 1905.    CSN: [id number]     Arrival date & time 10/19/12  1652 History   First MD Initiated Contact with Patient 10/19/12 1900     Chief Complaint  Patient presents with  . Dental Pain   (Consider location/radiation/quality/duration/timing/severity/associated sxs/prior Treatment) The history is provided by the patient.   HPI Comments: Sonya Aguilar is a 42 y.o. female who presents to the Emergency Department complaining of waxing and waning severe upper left dental pain onset 7 days ago. Describes pain as 10/10 in severity and throbbing in nature w/ radiation to left side of face. Reports associated subjective chills. Denies associated fever, sore throat, dysphagia, otalgia, headache, and sinus congestion.  Denies any aggravating or alleviating factors. Reports trying ibuprofen and tylenol with no relief of symptoms. Does not have a dentist.   History reviewed. No pertinent past medical history. Past Surgical History  Procedure Laterality Date  . Cesarean section     Family History  Problem Relation Age of Onset  . Hypertension Mother   . Hypertension Father   . Diabetes Father    History  Substance Use Topics  . Smoking status: Never Smoker   . Smokeless tobacco: Not on file  . Alcohol Use: No   OB History   Grav Para Term Preterm Abortions TAB SAB Ect Mult Living                 Review of Systems  Constitutional: Negative for fever.  HENT: Positive for dental problem.   All other systems reviewed and are negative.  A complete 10 system review of systems was obtained and all systems are negative except as noted in the HPI and PMH.    Allergies  Review of patient's allergies indicates no known allergies.  Home Medications   Current  Outpatient Rx  Name  Route  Sig  Dispense  Refill  . acetaminophen (TYLENOL) 500 MG tablet   Oral   Take 500 mg by mouth every 6 (six) hours as needed. For tooth pain         . HYDROcodone-acetaminophen (NORCO/VICODIN) 5-325 MG per tablet   Oral   Take 1 tablet by mouth every 4 (four) hours as needed for pain.   15 tablet   0   . penicillin v potassium (VEETID) 500 MG tablet   Oral   Take 1 tablet (500 mg total) by mouth 4 (four) times daily.   40 tablet   0    BP 149/80  Pulse 61  Temp(Src) 98.8 F (37.1 C) (Oral)  Resp 20  SpO2 100% Physical Exam  Nursing note and vitals reviewed. Constitutional: She is oriented to person, place, and time. She appears well-developed and well-nourished. No distress.  HENT:  Head: Normocephalic and atraumatic.  Right Ear: External ear normal.  Left Ear: External ear normal.  Nose: Nose normal.  Mouth/Throat: Uvula is midline, oropharynx is clear and moist and mucous membranes are normal. No trismus in the jaw. Abnormal dentition. Dental caries present. No edematous.    Eyes: Conjunctivae are normal.  Neck: Normal range of motion. Neck supple.  Cardiovascular: Normal rate.   Pulmonary/Chest: Effort normal.  Musculoskeletal: Normal range of motion.  Lymphadenopathy:    She has no cervical adenopathy.  Neurological: She is alert  and oriented to person, place, and time. No cranial nerve deficit.  Skin: Skin is warm and dry. She is not diaphoretic.  Psychiatric: She has a normal mood and affect.    ED Course  Procedures (including critical care time) Medications  ketorolac (TORADOL) injection 60 mg (60 mg Intramuscular Given 10/19/12 1927)    Labs Review Labs Reviewed - No data to display Imaging Review No results found.  MDM   1. Pain, dental     Afebrile, NAD, non-toxic appearing, AAOx4. Patient with toothache.  No gross abscess.  Exam unconcerning for Ludwig's angina or spread of infection.  Will treat with penicillin  and pain medicine.  Urged patient to follow-up with dentist. Return precautions discussed.  Patient is agreeable to plan. Patient is stable at time of discharge    I personally performed the services described in this documentation, which was scribed in my presence. The recorded information has been reviewed and is accurate.     Jeannetta Ellis, PA-C 10/19/12 2043

## 2012-10-19 NOTE — ED Notes (Signed)
Pt alert and oriented x4. Respirations even and unlabored, bilateral symmetrical rise and fall of chest. Skin warm and dry. In no acute distress. Denies needs.   

## 2012-10-19 NOTE — ED Provider Notes (Signed)
Medical screening examination/treatment/procedure(s) were performed by non-physician practitioner and as supervising physician I was immediately available for consultation/collaboration.  Shilpa Bushee T Montgomery Favor, MD 10/19/12 2319 

## 2012-10-19 NOTE — ED Notes (Signed)
Pt reports top left dental pain 10/10 x1 week. Reports she thinks her filling fell out. Has had earache and left sided head pain from tooth.

## 2012-10-29 ENCOUNTER — Ambulatory Visit: Payer: Self-pay | Admitting: Family Medicine

## 2012-11-05 ENCOUNTER — Encounter: Payer: Self-pay | Admitting: Family Medicine

## 2012-11-05 ENCOUNTER — Ambulatory Visit (INDEPENDENT_AMBULATORY_CARE_PROVIDER_SITE_OTHER): Payer: Self-pay | Admitting: Family Medicine

## 2012-11-05 VITALS — BP 132/89 | HR 76 | Ht 67.0 in | Wt 165.0 lb

## 2012-11-05 DIAGNOSIS — M25569 Pain in unspecified knee: Secondary | ICD-10-CM

## 2012-11-05 DIAGNOSIS — M25561 Pain in right knee: Secondary | ICD-10-CM

## 2012-11-05 NOTE — Patient Instructions (Signed)
Call me when the [facility] coverage goes through. You have 3 options here: 1. Repeat the cortisone injection. 2. Do physical therapy. 3. MRI of the knee (typically only necessary if we're looking at surgery). You could do both 1 and 2 also.

## 2012-11-06 ENCOUNTER — Encounter: Payer: Self-pay | Admitting: Family Medicine

## 2012-11-06 NOTE — Assessment & Plan Note (Signed)
Concerning that she's having buckling, giving out still.  Did improve with the cortisone injection though.  No locking.  Consistent with likely medial meniscal tear.  Call when cone coverage goes through - could consider repeat injection, formal PT, and/or MRI.

## 2012-11-06 NOTE — Progress Notes (Signed)
Patient ID: Sonya Aguilar, female   DOB: January 25, 1970, 42 y.o.   MRN: [id number]  Subjective:    Patient ID: Sonya Aguilar, female    DOB: 1970/12/03, 42 y.o.   MRN: [id number]  PCP: [facility]  HPI 42 yo F here for f/u right knee pain.  9/9: Patient denies known injury or trauma. 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 Has had pain within right knee for years but more recently started giving out, feeling like knee was 'popping out of place.' Over a week ago had fairly significant swelling - improved with ice/heat, otc ibuprofen. Then worsened again Thursday causing her to come to ED - had 35mL blood tinged serous fluid aspirated from her knee - only mild improvement in pain though motion has improved. Feeling of catching within right knee and instability.  No true locking. When she has these popping out sensations they last for seconds, she stops and knee feels better.  10/17/11: Patient reports she's about 75% better from initial visit. Still struggles with medial and posterior pain, swelling especially at end of day. Has been icing, taking voltaren Using a knee brace. Still buckling and throbbing at times. Reports she does not qualify for [facility] coverage but uncertain if [facility] has received her paperwork.  11/05/12: Patient reports she did well following cortisone injection but continues to have buckling, discomfort. Not a whole lot of pain but some tenderness medially. Popping and giving way. Worse if on feet for a long time. Been worse the past 2 months.  History reviewed. No pertinent past medical history.  Current Outpatient Prescriptions on File Prior to Visit  Medication Sig Dispense Refill  . acetaminophen (TYLENOL) 500 MG tablet Take 500 mg by mouth every 6 (six) hours as needed. For tooth pain      . HYDROcodone-acetaminophen (NORCO/VICODIN) 5-325 MG per tablet Take 1 tablet by mouth every 4 (four) hours as needed for pain.  15 tablet  0  .  penicillin v potassium (VEETID) 500 MG tablet Take 1 tablet (500 mg total) by mouth 4 (four) times daily.  40 tablet  0   No current facility-administered medications on file prior to visit.    Past Surgical History  Procedure Laterality Date  . Cesarean section      No Known Allergies  History   Social History  . Marital Status: Married    Spouse Name: N/A    Number of Children: N/A  . Years of Education: N/A   Occupational History  . Not on file.   Social History Main Topics  . Smoking status: Never Smoker   . Smokeless tobacco: Not on file  . Alcohol Use: No  . Drug Use: No  . Sexual Activity: Not on file   Other Topics Concern  . Not on file   Social History Narrative  . No narrative on file    Family History  Problem Relation Age of Onset  . Hypertension Mother   . Hypertension Father   . Diabetes Father     BP 132/89  Pulse 76  Ht 5\' 7"  (1.702 m)  Wt 165 lb (74.844 kg)  BMI 25.84 kg/m2  Review of Systems See HPI above.    Objective:   Physical Exam Gen: NAD  R knee: Mild effusion.  No gross deformity, ecchymoses. Mild TTP medial joint line.  No lateral joint line, patellar facet TTP. ROM 0 - 110 degrees. Negative ant/post drawers. Negative valgus/varus testing. Negative lachmanns. Mild  pain medially with mcmurrays, apleys.  Negative patellar apprehension. NV intact distally.     Assessment & Plan:  1. Right knee pain - Concerning that she's having buckling, giving out still.  Did improve with the cortisone injection though.  No locking.  Consistent with likely medial meniscal tear.  Call when [facility] coverage goes through - could consider repeat injection, formal PT, and/or MRI.

## 2013-10-23 ENCOUNTER — Other Ambulatory Visit (HOSPITAL_COMMUNITY): Payer: Self-pay | Admitting: Family Medicine

## 2013-10-23 DIAGNOSIS — Z1231 Encounter for screening mammogram for malignant neoplasm of breast: Secondary | ICD-10-CM

## 2013-10-29 ENCOUNTER — Ambulatory Visit (HOSPITAL_COMMUNITY)
Admission: RE | Admit: 2013-10-29 | Discharge: 2013-10-29 | Disposition: A | Discharge status: Home / Self Care | Payer: Self-pay | Source: Ambulatory Visit | Attending: Family Medicine | Admitting: Family Medicine

## 2013-10-29 DIAGNOSIS — Z1231 Encounter for screening mammogram for malignant neoplasm of breast: Secondary | ICD-10-CM

## 2013-12-26 ENCOUNTER — Other Ambulatory Visit: Payer: Self-pay

## 2013-12-26 ENCOUNTER — Emergency Department (HOSPITAL_COMMUNITY)
Admission: EM | Admit: 2013-12-26 | Discharge: 2013-12-27 | Disposition: A | Discharge status: Home / Self Care | Payer: Self-pay | Attending: Emergency Medicine | Admitting: Emergency Medicine

## 2013-12-26 ENCOUNTER — Encounter (HOSPITAL_COMMUNITY): Payer: Self-pay | Admitting: Adult Health

## 2013-12-26 DIAGNOSIS — Z7982 Long term (current) use of aspirin: Secondary | ICD-10-CM | POA: Insufficient documentation

## 2013-12-26 DIAGNOSIS — Z792 Long term (current) use of antibiotics: Secondary | ICD-10-CM | POA: Insufficient documentation

## 2013-12-26 DIAGNOSIS — R079 Chest pain, unspecified: Secondary | ICD-10-CM | POA: Insufficient documentation

## 2013-12-26 DIAGNOSIS — Z3202 Encounter for pregnancy test, result negative: Secondary | ICD-10-CM | POA: Insufficient documentation

## 2013-12-26 DIAGNOSIS — R200 Anesthesia of skin: Secondary | ICD-10-CM | POA: Insufficient documentation

## 2013-12-26 DIAGNOSIS — R202 Paresthesia of skin: Secondary | ICD-10-CM | POA: Insufficient documentation

## 2013-12-26 LAB — CBC
HCT: 34.8 % — ABNORMAL LOW (ref 36.0–46.0)
Hemoglobin: 11.5 g/dL — ABNORMAL LOW (ref 12.0–15.0)
MCH: 27.3 pg (ref 26.0–34.0)
MCHC: 33 g/dL (ref 30.0–36.0)
MCV: 82.5 fL (ref 78.0–100.0)
Platelets: 269 10*3/uL (ref 150–400)
RBC: 4.22 MIL/uL (ref 3.87–5.11)
RDW: 14.4 % (ref 11.5–15.5)
WBC: 6.5 10*3/uL (ref 4.0–10.5)

## 2013-12-26 NOTE — ED Notes (Addendum)
Presents with left sided chest pain began about 3 weeks ago, pain is intermittent.while at work today pain became severe. Worse when bending forward chest pain described as sharp at first and then tightness radiates into left shoulder and numbness into left arm associated with nausea.  Pain rated 6/10. Reports numbness in left foot.

## 2013-12-26 NOTE — ED Provider Notes (Signed)
CSN: [id number]     Arrival date & time 12/26/13  2306 History  This chart was scribed for Ezequiel Essex, MD by Evelene Croon, ED Scribe. This patient was seen in room D31C/D31C and the patient's care was started 11:31 PM.     Chief Complaint  Patient presents with  . Chest Pain   The history is provided by the patient. No language interpreter was used.    HPI Comments:  Sonya Aguilar is a 43 y.o. female who presents to the Emergency Department complaining of intermittent moderate chest pain that started about 1 week ago. She reports pain to her left and central chest and states each episode lasts about 5 min. She states pain worsened today. Pt also complains of associated numbness and tingling to her left hand, wrist, shoulder and LLE for about 1 week, which also worsened today. She states the numbness and tingling has been constant for the last week but has waxed and waned in severity. She also notes vertigo, nausea and mild SOB. She denies vomiting and change in PO intake. She has been taking ASA without relief. Pt denies cardiac hx and smoking hx.  History reviewed. No pertinent past medical history. Past Surgical History  Procedure Laterality Date  . Cesarean section     Family History  Problem Relation Age of Onset  . Hypertension Mother   . Hypertension Father   . Diabetes Father    History  Substance Use Topics  . Smoking status: Never Smoker   . Smokeless tobacco: Not on file  . Alcohol Use: No   OB History    No data available     Review of Systems  A complete 10 system review of systems was obtained and all systems are negative except as noted in the HPI and PMH.    Allergies  Review of patient's allergies indicates no known allergies.  Home Medications   Prior to Admission medications   Medication Sig Start Date End Date Taking? Authorizing Provider  aspirin 325 MG tablet Take 325 mg by mouth every 6 (six) hours as needed for mild pain.   Yes  Historical Provider, MD  acetaminophen (TYLENOL) 500 MG tablet Take 500 mg by mouth every 6 (six) hours as needed. For tooth pain    Historical Provider, MD  HYDROcodone-acetaminophen (NORCO/VICODIN) 5-325 MG per tablet Take 1 tablet by mouth every 4 (four) hours as needed for pain. 10/19/12   Jennifer L Piepenbrink, PA-C  penicillin v potassium (VEETID) 500 MG tablet Take 1 tablet (500 mg total) by mouth 4 (four) times daily. 10/19/12   Jennifer L Piepenbrink, PA-C   BP 139/59 mmHg  Pulse 64  Temp(Src) 98.2 F (36.8 C) (Oral)  Resp 23  SpO2 99%  LMP 11/28/2013 (Approximate) Physical Exam  Constitutional: She is oriented to person, place, and time. She appears well-developed and well-nourished. No distress.  HENT:  Head: Normocephalic and atraumatic.  Mouth/Throat: Oropharynx is clear and moist. No oropharyngeal exudate.  Eyes: Conjunctivae and EOM are normal. Pupils are equal, round, and reactive to light.  Neck: Normal range of motion. Neck supple.  No meningismus.  Cardiovascular: Normal rate, regular rhythm, normal heart sounds and intact distal pulses.   No murmur heard. Pulmonary/Chest: Effort normal and breath sounds normal. No respiratory distress. She exhibits tenderness.  Left chest wall tenderness  Abdominal: Soft. There is no tenderness. There is no rebound and no guarding.  Musculoskeletal: Normal range of motion. She exhibits no edema or tenderness.  Neurological: She is alert and oriented to person, place, and time. No cranial nerve deficit. She exhibits normal muscle tone. Coordination normal.  No ataxia on finger to nose bilaterally. No pronator drift. 5/5 strength throughout. CN 2-12 intact. Negative Romberg. Equal grip strength. Sensation intact except for subjectively decreased sensation to LUE. Gait is normal.   Skin: Skin is warm.  Psychiatric: She has a normal mood and affect. Her behavior is normal.  Nursing note and vitals reviewed.   ED Course  Procedures    DIAGNOSTIC STUDIES:  Oxygen Saturation is 100% on RA, normal by my interpretation.    COORDINATION OF CARE:  11:44 PM Discussed treatment plan with pt at bedside and pt agreed to plan.' Labs Review  1:42 AM Pt updated with results. Notes pain and numbness have improved but numbness is still present.  Labs Reviewed  CBC - Abnormal; Notable for the following:    Hemoglobin 11.5 (*)    HCT 34.8 (*)    All other components within normal limits  D-DIMER, QUANTITATIVE - Abnormal; Notable for the following:    D-Dimer, Quant 0.52 (*)    All other components within normal limits  BASIC METABOLIC PANEL  TROPONIN I  TROPONIN I  URINALYSIS, ROUTINE W REFLEX MICROSCOPIC  PREGNANCY, URINE  I-STAT TROPOININ, ED    Imaging Review Dg Chest 2 View  12/27/2013   CLINICAL DATA:  Acute chest pain.  Initial encounter.  EXAM: CHEST  2 VIEW  COMPARISON:  05/01/2011  FINDINGS: The cardiomediastinal silhouette is stable.  There is no evidence of focal airspace disease, pulmonary edema, suspicious pulmonary nodule/mass, pleural effusion, or pneumothorax. No acute bony abnormalities are identified.  A severe thoracic scoliosis again noted.  IMPRESSION: No active cardiopulmonary disease.   Electronically Signed   By: Hassan Rowan M.D.   On: 12/27/2013 00:58   Ct Head Wo Contrast  12/27/2013   CLINICAL DATA:  Initial evaluation for several weeks of dizziness, weakness in both legs with numbness.  EXAM: CT HEAD WITHOUT CONTRAST  TECHNIQUE: Contiguous axial images were obtained from the base of the skull through the vertex without intravenous contrast.  COMPARISON:  Prior study from 07/24/2003.  FINDINGS: There is no acute intracranial hemorrhage or infarct. No mass lesion or midline shift. Gray-white matter differentiation is well maintained. Ventricles are normal in size without evidence of hydrocephalus. CSF containing spaces are within normal limits. No extra-axial fluid collection.  The calvarium is intact.   Orbital soft tissues are within normal limits.  The paranasal sinuses and mastoid air cells are well pneumatized and free of fluid.  Scalp soft tissues are unremarkable.  IMPRESSION: Normal head CT with no acute intracranial abnormality identified.   Electronically Signed   By: Jeannine Boga M.D.   On: 12/27/2013 01:11   Ct Angio Chest Pe W/cm &/or Wo Cm  12/27/2013   CLINICAL DATA:  Acute chest pain and shortness of breath. Initial encounter.  EXAM: CT ANGIOGRAPHY CHEST WITH CONTRAST  TECHNIQUE: Multidetector CT imaging of the chest was performed using the standard protocol during bolus administration of intravenous contrast. Multiplanar CT image reconstructions and MIPs were obtained to evaluate the vascular anatomy.  CONTRAST:  144mL OMNIPAQUE IOHEXOL 350 MG/ML SOLN  COMPARISON:  12/26/2013 and prior radiographs  FINDINGS: This is a technically satisfactory study.  No pulmonary emboli are identified.  There is no evidence of thoracic aortic aneurysm.  Cardiomegaly identified.  There is no evidence of pleural or pericardial effusions.  No enlarged lymph  nodes are identified.  The lungs are clear.  There is no evidence of airspace disease, consolidation, nodule, mass or endobronchial/ endotracheal lesion.  A severe thoracic scoliosis is again noted. No acute or suspicious bony abnormalities are identified.  The visualized upper abdomen is unremarkable.  Review of the MIP images confirms the above findings.  IMPRESSION: No evidence of acute abnormality. No evidence of pulmonary emboli or thoracic aortic aneurysm.  Cardiomegaly.  Severe thoracic scoliosis.   Electronically Signed   By: Hassan Rowan M.D.   On: 12/27/2013 02:02   Mr Brain Wo Contrast  12/27/2013   CLINICAL DATA:  Chest pain starting 1 week ago. Numbness and tingling of the left hand and upper extremity, worsening today. Some vertigo.  EXAM: MRI HEAD WITHOUT CONTRAST  TECHNIQUE: Multiplanar, multiecho pulse sequences of the brain and  surrounding structures were obtained without intravenous contrast.  COMPARISON:  Head CT same day  FINDINGS: The brain has a normal appearance on all pulse sequences without evidence of malformation, atrophy, old or acute infarction, mass lesion, hemorrhage, hydrocephalus or extra-axial collection. No pituitary mass. No fluid in the sinuses, middle ears or mastoids. No skull or skullbase lesion. There is flow in the major vessels at the base of the brain. Major venous sinuses show flow.  IMPRESSION: Normal MRI of the brain.   Electronically Signed   By: Nelson Chimes M.D.   On: 12/27/2013 08:34     EKG Interpretation None      MDM   Final diagnoses:  Paresthesias  Left sided numbness  Chest pain, unspecified chest pain type  intermittent L sided chest pain x 3weeks, lasting 5-10 minutes at a time. Worse with palpation and bending over.  Associated with paresthesias in RUE and nausea.   Numbness in LUE has been persistent for 1 week and does not change.    EKG nsr.  Troponin negative.  Chest pain seems atypical for ACS and is somewhat reproducible. D-dimer elevated  CTPE negative.  Patient's second complaint of LUE numbness with intermittent numbness in LLE.  No weakness. NIHSS 0.  Unlikely to be CVA given normal CT head.  Probable peripheral in etiology. D/w patient outpatient neuro followup and MRI.  She requests MRI be done here. It will be ordered to rule out small CVA. Dr. Betsey Holiday to assume care.   Date: 12/26/2013  Rate: 79  Rhythm: normal sinus rhythm  QRS Axis: normal  Intervals: normal  ST/T Wave abnormalities: normal  Conduction Disutrbances:none  Narrative Interpretation:   Old EKG Reviewed: none available     I personally performed the services described in this documentation, which was scribed in my presence. The recorded information has been reviewed and is accurate.   Ezequiel Essex, MD 12/27/13 1800

## 2013-12-26 NOTE — ED Notes (Signed)
Patient transported to X-ray 

## 2013-12-26 NOTE — ED Notes (Signed)
Pt reports intermittent chest tightness for a few weeks - states she has some associated shortness of breath and dizziness - pt also admits today/yesterday she began experiencing left shoulder pain and hand tingling w/ the chest tightness. Pt denies any shortness of breath at present, speaking complete sentences - no acute distress.

## 2013-12-27 ENCOUNTER — Emergency Department (HOSPITAL_COMMUNITY): Payer: Self-pay

## 2013-12-27 ENCOUNTER — Encounter (HOSPITAL_COMMUNITY): Payer: Self-pay

## 2013-12-27 LAB — TROPONIN I: Troponin I: <0.3 ng/mL (ref <0.30)

## 2013-12-27 LAB — BASIC METABOLIC PANEL
Anion gap: 12 (ref 5–15)
BUN: 9 mg/dL (ref 6–23)
CALCIUM: 8.8 mg/dL (ref 8.4–10.5)
CO2: 24 mEq/L (ref 19–32)
Chloride: 103 mEq/L (ref 96–112)
Creatinine, Ser: 0.63 mg/dL (ref 0.50–1.10)
Glucose, Bld: 83 mg/dL (ref 70–99)
POTASSIUM: 3.7 meq/L (ref 3.7–5.3)
SODIUM: 139 meq/L (ref 137–147)

## 2013-12-27 LAB — URINALYSIS, ROUTINE W REFLEX MICROSCOPIC
Bilirubin Urine: NEGATIVE
GLUCOSE, UA: NEGATIVE mg/dL
Hgb urine dipstick: NEGATIVE
KETONES UR: NEGATIVE mg/dL
LEUKOCYTES UA: NEGATIVE
NITRITE: NEGATIVE
PH: 6.5 (ref 5.0–8.0)
Protein, ur: NEGATIVE mg/dL
SPECIFIC GRAVITY, URINE: 1.005 (ref 1.005–1.030)
Urobilinogen, UA: 0.2 mg/dL (ref 0.0–1.0)

## 2013-12-27 LAB — D-DIMER, QUANTITATIVE: D-Dimer, Quant: 0.52 ug/mL-FEU — ABNORMAL HIGH (ref 0.00–0.48)

## 2013-12-27 LAB — PREGNANCY, URINE: PREG TEST UR: NEGATIVE

## 2013-12-27 MED ORDER — IOHEXOL 350 MG/ML SOLN
100.0000 mL | Freq: Once | INTRAVENOUS | Status: AC | PRN
  Administered 2013-12-27: 100 mL via INTRAVENOUS

## 2013-12-27 NOTE — Discharge Instructions (Signed)
Paresthesia Paresthesia is a burning or prickling feeling. This feeling can happen in any part of the body. It often happens in the hands, arms, legs, or feet. HOME CARE  Avoid drinking alcohol.  Try massage or needle therapy (acupuncture) to help with your problems.  Keep all doctor visits as told. GET HELP RIGHT AWAY IF:   You feel weak.  You have trouble walking or moving.  You have problems speaking or seeing.  You feel confused.  You cannot control when you poop (bowel movement) or pee (urinate).  You lose feeling (numbness) after an injury.  You pass out (faint).  Your burning or prickling feeling gets worse when you walk.  You have pain, cramps, or feel dizzy.  You have a rash. MAKE SURE YOU:   Understand these instructions.  Will watch your condition.  Will get help right away if you are not doing well or get worse. Document Released: 12/24/2007 Document Revised: 04/04/2011 Document Reviewed: 10/01/2010 [facility] Patient Information 2015 [place], [place]. This information is not intended to replace advice given to you by your health care provider. Make sure you discuss any questions you have with your health care provider.  Chest Pain (Nonspecific) It is often hard to give a diagnosis for the cause of chest pain. There is always a chance that your pain could be related to something serious, such as a heart attack or a blood clot in the lungs. You need to follow up with your doctor. HOME CARE  If antibiotic medicine was given, take it as directed by your doctor. Finish the medicine even if you start to feel better.  For the next few days, avoid activities that bring on chest pain. Continue physical activities as told by your doctor.  Do not use any tobacco products. This includes cigarettes, chewing tobacco, and e-cigarettes.  Avoid drinking alcohol.  Only take medicine as told by your doctor.  Follow your doctor's suggestions for more testing if your chest  pain does not go away.  Keep all doctor visits you made. GET HELP IF:  Your chest pain does not go away, even after treatment.  You have a rash with blisters on your chest.  You have a fever. GET HELP RIGHT AWAY IF:   You have more pain or pain that spreads to your arm, neck, jaw, back, or belly (abdomen).  You have shortness of breath.  You cough more than usual or cough up blood.  You have very bad back or belly pain.  You feel sick to your stomach (nauseous) or throw up (vomit).  You have very bad weakness.  You pass out (faint).  You have chills. This is an emergency. Do not wait to see if the problems will go away. Call your local emergency services (911 in U.S[place] Do not drive yourself to the hospital. MAKE SURE YOU:   Understand these instructions.  Will watch your condition.  Will get help right away if you are not doing well or get worse. Document Released: 06/29/2007 Document Revised: 01/15/2013 Document Reviewed: 06/29/2007 [facility] Patient Information 2015 [place], [place]. This information is not intended to replace advice given to you by your health care provider. Make sure you discuss any questions you have with your health care provider.   Emergency Department Resource Guide 1) Find a Doctor and Pay Out of Pocket Although you won't have to find out who is covered by your insurance plan, it is a good idea to ask around and get recommendations. You will  then need to call the office and see if the doctor you have chosen will accept you as a new patient and what types of options they offer for patients who are self-pay. Some doctors offer discounts or will set up payment plans for their patients who do not have insurance, but you will need to ask so you aren't surprised when you get to your appointment.  2) Contact Your Local Health Department Not all health departments have doctors that can see patients for sick visits, but many do, so it is worth a call to see  if yours does. If you don't know where your local health department is, you can check in your phone book. The CDC also has a tool to help you locate your state's health department, and many state websites also have listings of all of their local health departments.  3) Find a [facility] If your illness is not likely to be very severe or complicated, you may want to try a walk in clinic. These are popping up all over the country in pharmacies, drugstores, and shopping centers. They're usually staffed by nurse practitioners or physician assistants that have been trained to treat common illnesses and complaints. They're usually fairly quick and inexpensive. However, if you have serious medical issues or chronic medical problems, these are probably not your best option.  No Primary Care Doctor: - Call Health Connect at  [phone] - they can help you locate a primary care doctor that  accepts your insurance, provides certain services, etc. - Physician Referral Service- [phone]  Chronic Pain Problems: Organization         Address  Phone   Notes  [facility]  [phone] Patients need to be referred by their primary care doctor.   Medication Assistance: Organization         Address  Phone   Notes  [facility] Medication [facility] [address] [place], [place] [address] [phone] --Must be a resident of [facility] -- Must have NO insurance coverage whatsoever (no Medicaid/ Medicare, etc.) -- The pt. MUST have a primary care doctor that directs their care regularly and follows them in the community   MedAssist  [phone]   [employer]  [phone]    Agencies that provide inexpensive medical care: Organization         Address  Phone   Notes  [place]  [phone]   [facility]    [phone]   [facility] [place], [place] [address] [phone]   [place] [address], [place] [phone]   Planned Parenthood    [phone]   [facility]    [phone]   [employer] and [place] [address], [place] Phone:  [phone], Fax:  [phone] Hours of Operation:  9 am - 6 pm, M-F.  Also accepts Medicaid/Medicare and self-pay.  [facility] for [place] [place], [address], [place] Phone: [phone], Fax: [phone]. Hours of Operation:  8:30 am - 5:30 pm, M-F.  Also accepts Medicaid and self-pay.  [facility] [place] [address] [place] Phone: [phone]   [place], [place], [place] [phone], Ext. [phone] Mondays & Thursdays: 7-9 AM.  First 15 patients are seen on a first come, first serve  basis.    Gray Providers:  Organization         Address  Phone   Notes  [facility] [address], [place] [phone] Also accepts self-pay patients.  [facility] [address], [place]  [phone]   [place], [address], [place] [phone]   [facility] [address] [place] [phone]   Lucianne Lei [address], [place]   [phone] Only accepts [place] Access [place] patients after they have their name applied to their card.   Self-Pay (no insurance) in [facility]:  Organization         Address  Phone   Notes  Sickle Cell Patients, [facility] [place] [phone]   [facility] [place] [phone]   [facility] [place]  [place], [place],  [phone]   [facility]/Dr. Osei-Bonsu  [address], [place] or [address], [place] [phone] Phone number for both [place] and [place] locations is the same.  Urgent Medical and [facility] [address] [place] [phone]   [facility] [address], [place] or [address] [phone]   [facility] [address], [place] [phone], phone; [phone], fax Sees patients 1st and 3rd Saturday of every month.  Must not qualify for public or private insurance (i.e. Medicaid, Medicare, [place] Health Choice, Veterans' Benefits)  Household income should be no more than 200% of the poverty level The clinic cannot treat you if you are pregnant or think you are pregnant  Sexually transmitted diseases are not treated at the clinic.    Dental Care: Organization         Address  Phone  Notes  [facility] Department of [facility] Rodriguez [place] [phone] Accepts children up to age 63 who are enrolled in [place] or [place]; pregnant women with a Medicaid card; and children who have applied for Medicaid or [place] Health Choice, but were declined, whose parents can pay a reduced fee at time of service.  [facility] Department of [facility]  [address], [place] [phone] Accepts children up to age 42 who are enrolled in [place] or [place]; pregnant women with a Medicaid card; and children who have applied for Medicaid or [place] Health Choice, but were declined, whose parents can pay a reduced fee at time of service.  [facility] Adult [facility] Access PRO[facility]  [place] [phone] Patients are seen by appointment only. Walk-ins are not accepted. Cro[place] will see patients 76 years of age and older. Monday - Tuesday (8am-5pm) Most Wednesdays (8:30-5pm) $30 per visit, cash only  [facility] Adult Dental Access PROGRAM  [address], [facility] [phone] Patients are  seen by appointment only. Walk-ins are not accepted. [place] will see patients 42 years of age and older. One Wednesday Evening (Monthly: Volunteer Based).  $30 per visit, cash only  [place]  [phone] for adults; Children under age 21, call Graduate Pediatric Dentistry at [phone]. Children aged 77-14, please call [phone] to request a pediatric application.  Dental services are provided in all areas of dental care including fillings, crowns and bridges, complete and partial dentures, implants, gum treatment, root canals, and extractions. Preventive care is also provided. Treatment is provided to both adults and children. Patients are selected via a lottery and there is often a waiting list.   [facility] [address] [place]  [phone] [email or web address]   [facility] [address] [place], [place] [phone], Ext. [phone] Second and Fourth Thursday of each month, opens at 6:30 AM; Clinic ends at 9 AM.  Patients are seen on a first-come first-served basis, and a limited number are seen during each clinic.   [facility]  [address] Hillard Danker [place], [place] [phone]   Eligibility Requirements You must have lived in [place], [place], or [place] counties for at least the last three months.   You cannot be eligible for state or federal sponsored [employer], including [employer], [place], or [employer].   You generally cannot be eligible for healthcare insurance through your employer.    How to apply: Eligibility screenings are held every Tuesday and Wednesday afternoon from 1:00 pm until 4:00 pm. You do not need an appointment for the interview!  [facility] [address], [place], [place]   [place]  [place] Department  [place]  [phone]     Behavioral Health Resources in the Community: Intensive Outpatient Programs Organization         Address  Phone  Notes  [place]. [address], [place], [place] [phone]   [facility] Outpatient [address] [place], [place]   ADS: Alcohol & Drug Svcs [address] [place], [place]   [place] [address]  [place], [place] or [phone]   Substance Abuse Resources Organization         Address  Phone  Notes  Alcohol and Drug Services  [phone]   [place]  [phone]   [facility]   Chinita Pester  [phone]   Residential & Outpatient Substance Abuse Program  [phone]   Psychological Services Organization         Address  Phone  Notes  [facility] [place]  [place]  [phone]   [place] [address], [place] or [phone]    Mobile Crisis Teams Organization         Address  Phone  Notes  [facility], Mobile Crisis Care Unit  [phone]   Assertive Psychotherapeutic Services  [address] [place], [place]   [place] [address], [place] [phone]    Self-Help/Support Groups Organization         Address  Phone             Notes  [place]. of [place] - variety of support groups  [place] Call for more information  Narcotics Anonymous (NA), [facility]aring [facility] [address], [employer]   2 meetings at this location   Special educational needs teacher         Address  Phone  Notes  [facility]idential Treatment [place],    [place]  [phone]   [place]  [address] [place] [id number], [place], [place]   [place], [place] [phone] Admissions: 8am-3pm M-F  Incentives  Substance [place] [address]    [place], [place] [phone]   [facility] [address] [place], [place], [place]   [facility] [address]  [place], [place]   [facility] - Intensive Outpatient [address], [place], [place]   [facility] [address]  [place], [place] [phone] or [phone]   [facility] Services (RTS) [address], [place], [place] Accepts Medicaid  [facility] [place] [address]  [place] [phone] Substance Abuse/Addiction Treatment   [facility] Organization         Address  Phone  Notes  [facility]  [phone]   Domenic Schwab, PhD [address] Arlis Porta [place], [place]   [phone] or [phone]   [place], [place] [phone]   [facility] [address] [place], [place] [phone] Insurance/Medicaid/sponsorship through [facility] and Families [address]                                    [place], [place] [phone] [place] [address], [place] [phone]    Dr. Adele Schilder  [phone]   [facility] Dept. 1) [address] [place] 2) [place] 3)  [place], [place] [phone]  [phone]   [place] [phone] or [phone] (After Hours)

## 2013-12-27 NOTE — ED Notes (Signed)
Patient transported to CT 

## 2013-12-27 NOTE — ED Provider Notes (Signed)
Patient signed out to me to follow-up on MRI. Patient had been seen overnight for chest pain and had a thorough evaluation without any acute findings. No concern for acute coronary syndrome and no PE. Patient also complaining of tingling in the left upper extremity. MRI was performed and no acute findings were seen. No evidence of stroke. Symptoms seem to be more peripheral in origin. Will discharge, follow-up with primary care.  Orpah Greek, MD 12/27/13 [phone]

## 2013-12-27 NOTE — ED Notes (Signed)
Pt in MRI.

## 2013-12-30 ENCOUNTER — Encounter: Payer: Self-pay | Admitting: Family Medicine

## 2013-12-30 ENCOUNTER — Ambulatory Visit (INDEPENDENT_AMBULATORY_CARE_PROVIDER_SITE_OTHER): Payer: Self-pay | Admitting: Family Medicine

## 2013-12-30 VITALS — BP 125/86 | HR 66 | Ht 67.0 in | Wt 170.0 lb

## 2013-12-30 DIAGNOSIS — M25562 Pain in left knee: Secondary | ICD-10-CM

## 2013-12-30 DIAGNOSIS — M25561 Pain in right knee: Secondary | ICD-10-CM

## 2013-12-30 LAB — I-STAT TROPONIN, ED: TROPONIN I, POC: 0 ng/mL (ref 0.00–0.08)

## 2013-12-30 MED ORDER — METHYLPREDNISOLONE ACETATE 40 MG/ML IJ SUSP
40.0000 mg | Freq: Once | INTRAMUSCULAR | Status: AC
  Administered 2013-12-30: 40 mg via INTRA_ARTICULAR

## 2013-12-30 MED ORDER — METHYLPREDNISOLONE ACETATE 40 MG/ML IJ SUSP
40.0000 mg | Freq: Once | INTRAMUSCULAR | Status: AC
Start: 2013-12-30 — End: 2013-12-30
  Administered 2013-12-30: 40 mg via INTRA_ARTICULAR

## 2013-12-30 NOTE — Patient Instructions (Signed)
We could consider physical therapy in the future for your knees should you get insurance coverage. I would be hesitant to do these injections indefinitely as we discussed. Follow up with me as needed.

## 2014-01-01 NOTE — Progress Notes (Signed)
Patient ID: Sonya Aguilar, female   DOB: 04/02/1970, 43 y.o.   MRN: [id number]  Subjective:    Patient ID: Sonya Aguilar, female    DOB: [birth year], 43 y.o.   MRN: [id number]  PCP: [facility]  Knee Pain    43 yo F here for f/u right knee pain.  9/9: Patient denies known injury or trauma. 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 Has had pain within right knee for years but more recently started giving out, feeling like knee was 'popping out of place.' Over a week ago had fairly significant swelling - improved with ice/heat, otc ibuprofen. Then worsened again Thursday causing her to come to ED - had 84mL blood tinged serous fluid aspirated from her knee - only mild improvement in pain though motion has improved. Feeling of catching within right knee and instability.  No true locking. When she has these popping out sensations they last for seconds, she stops and knee feels better.  10/17/11: Patient reports she's about 75% better from initial visit. Still struggles with medial and posterior pain, swelling especially at end of day. Has been icing, taking voltaren Using a knee brace. Still buckling and throbbing at times. Reports she does not qualify for [facility] coverage but uncertain if [name] has received her paperwork.  11/05/12: Patient reports she did well following cortisone injection but continues to have buckling, discomfort. Not a whole lot of pain but some tenderness medially. Popping and giving way. Worse if on feet for a long time. Been worse the past 2 months.  12/30/13: Patient returns with diffuse anterior bilateral knee pain. Was denied for [facility] coverage and is looking into other options. Injection helped for several months in right knee. No catching, locking.  Does feel like knees can give out at times.  No past medical history on file.  Current Outpatient Prescriptions on File Prior to Visit  Medication Sig Dispense Refill  .  acetaminophen (TYLENOL) 500 MG tablet Take 500 mg by mouth every 6 (six) hours as needed. For tooth pain    . aspirin 325 MG tablet Take 325 mg by mouth every 6 (six) hours as needed for mild pain.    Marland Kitchen HYDROcodone-acetaminophen (NORCO/VICODIN) 5-325 MG per tablet Take 1 tablet by mouth every 4 (four) hours as needed for pain. 15 tablet 0  . penicillin v potassium (VEETID) 500 MG tablet Take 1 tablet (500 mg total) by mouth 4 (four) times daily. 40 tablet 0   No current facility-administered medications on file prior to visit.    Past Surgical History  Procedure Laterality Date  . Cesarean section      No Known Allergies  History   Social History  . Marital Status: Married    Spouse Name: N/A    Number of Children: N/A  . Years of Education: N/A   Occupational History  . Not on file.   Social History Main Topics  . Smoking status: Never Smoker   . Smokeless tobacco: Not on file  . Alcohol Use: No  . Drug Use: No  . Sexual Activity: Not on file   Other Topics Concern  . Not on file   Social History Narrative    Family History  Problem Relation Age of Onset  . Hypertension Mother   . Hypertension Father   . Diabetes Father     BP 125/86 mmHg  Pulse 66  Ht 5\' 7"  (1.702 m)  Wt 170 lb (77.111 kg)  BMI  26.62 kg/m2  LMP 11/28/2013 (Approximate)  Review of Systems See HPI above.    Objective:   Physical Exam Gen: NAD  Bilateral knees: No effusion.  No gross deformity, ecchymoses. Mild TTP medial and lateral joint lines, post patellar facets. FROM. Negative ant/post drawers. Negative valgus/varus testing. Negative lachmanns. Mild pain medially with mcmurrays, apleys.  Negative patellar apprehension. NV intact distally.    Assessment & Plan:  1. 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  Also concerned about possibility of meniscus tears though would be unusual to have this  bilaterally.  She is limited in what she can do with lack of insurance coverage.  Given bilateral cortisone injections today.  Call us if she obtains coverage and can add physical therapy or do an MRI of more symptomatic right knee if not improving.  After informed written consent, patient was seated on exam table. Right knee was prepped with alcohol swab and utilizing anteromedial approach, patient's right knee was injected intraarticularly with 3:1 marcaine: depomedrol. Patient tolerated the procedure well without immediate complications.  After informed written consent, patient was seated on exam table. Left knee was prepped with alcohol swab and utilizing anteromedial approach, patient's left knee was injected intraarticularly with 3:1 marcaine: depomedrol. Patient tolerated the procedure well without immediate complications.

## 2014-01-01 NOTE — Assessment & Plan Note (Signed)
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  Also concerned about possibility of meniscus tears though would be unusual to have this bilaterally.  She is limited in what she can do with lack of insurance coverage.  Given bilateral cortisone injections today.  Call us if she obtains coverage and can add physical therapy or do an MRI of more symptomatic right knee if not improving.  After informed written consent, patient was seated on exam table. Right knee was prepped with alcohol swab and utilizing anteromedial approach, patient's right knee was injected intraarticularly with 3:1 marcaine: depomedrol. Patient tolerated the procedure well without immediate complications.  After informed written consent, patient was seated on exam table. Left knee was prepped with alcohol swab and utilizing anteromedial approach, patient's left knee was injected intraarticularly with 3:1 marcaine: depomedrol. Patient tolerated the procedure well without immediate complications.

## 2015-02-12 ENCOUNTER — Other Ambulatory Visit: Payer: Self-pay

## 2015-02-12 DIAGNOSIS — Z1231 Encounter for screening mammogram for malignant neoplasm of breast: Secondary | ICD-10-CM

## 2015-03-11 ENCOUNTER — Other Ambulatory Visit: Payer: Self-pay | Admitting: Obstetrics and Gynecology

## 2015-03-11 DIAGNOSIS — Z1231 Encounter for screening mammogram for malignant neoplasm of breast: Secondary | ICD-10-CM

## 2015-03-25 ENCOUNTER — Ambulatory Visit
Admission: RE | Admit: 2015-03-25 | Discharge: 2015-03-25 | Disposition: A | Discharge status: Home / Self Care | Payer: No Typology Code available for payment source | Source: Ambulatory Visit | Attending: Obstetrics and Gynecology | Admitting: Obstetrics and Gynecology

## 2015-03-25 DIAGNOSIS — Z1231 Encounter for screening mammogram for malignant neoplasm of breast: Secondary | ICD-10-CM

## 2015-11-18 ENCOUNTER — Ambulatory Visit (HOSPITAL_COMMUNITY)
Admission: EM | Admit: 2015-11-18 | Discharge: 2015-11-18 | Disposition: A | Discharge status: Home / Self Care | Payer: No Typology Code available for payment source | Attending: Internal Medicine | Admitting: Internal Medicine

## 2015-11-18 ENCOUNTER — Encounter (HOSPITAL_COMMUNITY): Payer: Self-pay | Admitting: Emergency Medicine

## 2015-11-18 DIAGNOSIS — M549 Dorsalgia, unspecified: Secondary | ICD-10-CM | POA: Insufficient documentation

## 2015-11-18 DIAGNOSIS — Z79899 Other long term (current) drug therapy: Secondary | ICD-10-CM | POA: Insufficient documentation

## 2015-11-18 DIAGNOSIS — Z7982 Long term (current) use of aspirin: Secondary | ICD-10-CM | POA: Insufficient documentation

## 2015-11-18 DIAGNOSIS — R300 Dysuria: Secondary | ICD-10-CM | POA: Insufficient documentation

## 2015-11-18 LAB — POCT URINALYSIS DIP (DEVICE)
BILIRUBIN URINE: NEGATIVE
GLUCOSE, UA: NEGATIVE mg/dL
Hgb urine dipstick: NEGATIVE
Ketones, ur: NEGATIVE mg/dL
LEUKOCYTES UA: NEGATIVE
NITRITE: NEGATIVE
PH: 5.5 (ref 5.0–8.0)
Protein, ur: NEGATIVE mg/dL
Specific Gravity, Urine: 1.02 (ref 1.005–1.030)
Urobilinogen, UA: 0.2 mg/dL (ref 0.0–1.0)

## 2015-11-18 LAB — POCT PREGNANCY, URINE: PREG TEST UR: NEGATIVE

## 2015-11-18 MED ORDER — PHENAZOPYRIDINE HCL 200 MG PO TABS: 200.0000 mg | ORAL_TABLET | Freq: Three times a day (TID) | ORAL | 0 refills | Status: DC

## 2015-11-18 NOTE — ED Triage Notes (Signed)
The patient presented to the [facility] with a complaint of urinary frequency and dysuria that has now included lower back pain that started to radiate to her LLQ. The patient stated that the symptoms have been ongoing for about 3 months. The patient stated that she has been trying to use OTC medications to aleve the symptoms but they have gotten worse.

## 2015-11-18 NOTE — ED Provider Notes (Signed)
CSN: [id number]     Arrival date & time 11/18/15  1518 History   None    Chief Complaint  Patient presents with  . Urinary Frequency  . Dysuria   (Consider location/radiation/quality/duration/timing/severity/associated sxs/prior Treatment) HPI NP PT IS A 45 Y/O FEMALE WITH 1 MONTH HX OF DYSURIA. NO HOME TREATMENT. STATES SHE IS NOW HAVING BACK PAIN, NOT SEXUALLY ACTIVE AT THIS TIME. NO VAGINAL DISCHARGE. NO OTHER SIGNIFICANT MEDICAL HISTORY.  History reviewed. No pertinent past medical history. Past Surgical History:  Procedure Laterality Date  . CESAREAN SECTION     Family History  Problem Relation Age of Onset  . Hypertension Mother   . Hypertension Father   . Diabetes Father    Social History  Substance Use Topics  . Smoking status: Never Smoker  . Smokeless tobacco: Never Used  . Alcohol use No   OB History    No data available     Review of Systems  Denies: HEADACHE, NAUSEA, ABDOMINAL PAIN, CHEST PAIN, CONGESTION,  SHORTNESS OF BREATH  Allergies  Review of patient's allergies indicates no known allergies.  Home Medications   Prior to Admission medications   Medication Sig Start Date End Date Taking? Authorizing Provider  acetaminophen (TYLENOL) 500 MG tablet Take 500 mg by mouth every 6 (six) hours as needed. For tooth pain    Historical Provider, MD  aspirin 325 MG tablet Take 325 mg by mouth every 6 (six) hours as needed for mild pain.    Historical Provider, MD  HYDROcodone-acetaminophen (NORCO/VICODIN) 5-325 MG per tablet Take 1 tablet by mouth every 4 (four) hours as needed for pain. 10/19/12   Jennifer Piepenbrink, PA-C  penicillin v potassium (VEETID) 500 MG tablet Take 1 tablet (500 mg total) by mouth 4 (four) times daily. 10/19/12   Jennifer Piepenbrink, PA-C  phenazopyridine (PYRIDIUM) 200 MG tablet Take 1 tablet (200 mg total) by mouth 3 (three) times daily. 11/18/15   Konrad Felix, PA   Meds Ordered and Administered this Visit  Medications - No  data to display  BP 159/88 (BP Location: Left Arm)   Pulse 83   Temp 98.2 F (36.8 C) (Oral)   Resp 18   LMP 11/11/2015 (Exact Date)   SpO2 100%  No data found.   Physical Exam NURSES NOTES AND VITAL SIGNS REVIEWED. CONSTITUTIONAL: Well developed, well nourished, no acute distress HEENT: normocephalic, atraumatic EYES: Conjunctiva normal NECK:normal ROM, supple, no adenopathy PULMONARY:No respiratory distress, normal effort ABDOMINAL: Soft, ND, NT BS+, No CVAT MUSCULOSKELETAL: Normal ROM of all extremities,  SKIN: warm and dry without rash PSYCHIATRIC: Mood and affect, behavior are normal  Urgent Care Course   Clinical Course    Procedures (including critical care time)  Labs Review Labs Reviewed  URINE CULTURE  POCT URINALYSIS DIP (DEVICE)  POCT PREGNANCY, URINE    Imaging Review No results found.   Visual Acuity Review  Right Eye Distance:   Left Eye Distance:   Bilateral Distance:    Right Eye Near:   Left Eye Near:    Bilateral Near:        RX FOR PYRIDIUM, WITH PENDING URINE CULTURE MDM   1. Dysuria     Patient is reassured that there are no issues that require transfer to higher level of care at this time or additional tests. Patient is advised to continue home symptomatic treatment. Patient is advised that if there are new or worsening symptoms to attend the emergency department, contact primary care provider, or  return to UC. Instructions of care provided discharged home in stable condition.    THIS NOTE WAS GENERATED USING A VOICE RECOGNITION SOFTWARE PROGRAM. ALL REASONABLE EFFORTS  WERE MADE TO PROOFREAD THIS DOCUMENT FOR ACCURACY.  I have verbally reviewed the discharge instructions with the patient. A printed AVS was given to the patient.  All questions were answered prior to discharge.      Konrad Felix, [place] 11/18/15 [phone]

## 2015-11-19 LAB — URINE CULTURE

## 2015-11-25 ENCOUNTER — Telehealth (HOSPITAL_COMMUNITY): Payer: Self-pay | Admitting: Emergency Medicine

## 2015-11-25 NOTE — Telephone Encounter (Signed)
-----   Message from Sherlene Shams, MD sent at 11/21/2015 11:25 AM EDT ----- Please let patient know that urine culture did not clearly demonstrate a UTI.   Source of urinary discomfort remains unclear and will need further evaluation.   Please encourage patient to establish with a primary care provider or gynecologist for further evaluation of urinary discomfort.  LM

## 2015-11-25 NOTE — Telephone Encounter (Signed)
LM on [phone] Need to give lab results and to see how pt is doing from recent visit on 10/25 Notified pt in gen message that there is NO need to call back Unless not feeling any better, not tolerating meds well or if wanting to know lab results. Also let pt know labs can be obtained from [facility]

## 2015-11-25 NOTE — Telephone Encounter (Signed)
Pt called back... Called pt and notified of recent lab results Pt ID'd properly... Reports feeling better but still having intermittent sx Adv pt to est care w/PCP for further eval.  Pt states she was "very grateful" w/our service.  Pt verb understanding.

## 2016-11-03 ENCOUNTER — Other Ambulatory Visit: Payer: Self-pay | Admitting: Obstetrics and Gynecology

## 2016-11-03 DIAGNOSIS — Z1231 Encounter for screening mammogram for malignant neoplasm of breast: Secondary | ICD-10-CM

## 2017-03-13 ENCOUNTER — Ambulatory Visit (INDEPENDENT_AMBULATORY_CARE_PROVIDER_SITE_OTHER): Payer: BLUE CROSS/BLUE SHIELD

## 2017-03-13 ENCOUNTER — Other Ambulatory Visit: Payer: Self-pay

## 2017-03-13 ENCOUNTER — Encounter (HOSPITAL_COMMUNITY): Payer: Self-pay | Admitting: Emergency Medicine

## 2017-03-13 ENCOUNTER — Ambulatory Visit (HOSPITAL_COMMUNITY)
Admission: EM | Admit: 2017-03-13 | Discharge: 2017-03-13 | Disposition: A | Discharge status: Home / Self Care | Payer: BLUE CROSS/BLUE SHIELD | Attending: Urgent Care | Admitting: Urgent Care

## 2017-03-13 DIAGNOSIS — R0789 Other chest pain: Secondary | ICD-10-CM | POA: Diagnosis not present

## 2017-03-13 DIAGNOSIS — J09X2 Influenza due to identified novel influenza A virus with other respiratory manifestations: Secondary | ICD-10-CM | POA: Diagnosis not present

## 2017-03-13 DIAGNOSIS — J101 Influenza due to other identified influenza virus with other respiratory manifestations: Secondary | ICD-10-CM

## 2017-03-13 DIAGNOSIS — R05 Cough: Secondary | ICD-10-CM | POA: Diagnosis not present

## 2017-03-13 DIAGNOSIS — R059 Cough, unspecified: Secondary | ICD-10-CM

## 2017-03-13 DIAGNOSIS — R0602 Shortness of breath: Secondary | ICD-10-CM | POA: Diagnosis not present

## 2017-03-13 MED ORDER — PSEUDOEPHEDRINE HCL 60 MG PO TABS: 60.0000 mg | ORAL_TABLET | Freq: Three times a day (TID) | ORAL | 0 refills | Status: DC | PRN

## 2017-03-13 MED ORDER — CETIRIZINE HCL 10 MG PO TABS: 10.0000 mg | ORAL_TABLET | Freq: Every day | ORAL | 1 refills | Status: DC

## 2017-03-13 NOTE — Discharge Instructions (Signed)
For sore throat try using a honey-based tea. Use 3 teaspoons of honey with juice squeezed from half lemon. Place shaved pieces of ginger into 1/2-1 cup of water and warm over stove top. Then mix the ingredients and repeat every 4 hours as needed. Hydrate well with at least 2 liters (1 gallon) of water daily.

## 2017-03-13 NOTE — ED Provider Notes (Signed)
  MRN: [id number] DOB: Dec 12, 1970  Subjective:   Sonya Aguilar is a 47 y.o. female presenting for 2 week history of chills, fever, dry cough, mild body aches. Had tried Thera-flu, was diagnosed with influenza, completed course of Tamiflu, also used Tessalon. Her symptoms improved but she is now having shob, chest tightness/pain, sinus congestion, dry cough is persisting, intermittent subjective fever, headaches. Denies sinus pain, sore throat, n/v, abdominal pain. Denies smoking cigarettes. Does not use alcohol.   Sonya Aguilar is not currently taking any medications and has No Known Allergies.  Sonya Aguilar denies past medical history. Also  has a past surgical history that includes Cesarean section.  Objective:   Vitals: BP (!) 147/82 (BP Location: Left Arm)   Pulse 62   Temp 98.3 F (36.8 C) (Oral)   Resp 18   LMP 01/28/2017 (Exact Date)   SpO2 98%   BP Readings from Last 3 Encounters:  03/13/17 (!) 147/82  11/18/15 159/88  12/30/13 125/86    Physical Exam  Constitutional: She is oriented to person, place, and time. She appears well-developed and well-nourished.  HENT:  Mouth/Throat: Oropharynx is clear and moist.  Eyes: Right eye exhibits no discharge. Left eye exhibits no discharge. No scleral icterus.  Cardiovascular: Normal rate, regular rhythm and intact distal pulses. Exam reveals no gallop and no friction rub.  No murmur heard. Pulmonary/Chest: No respiratory distress. She has no wheezes. She has no rales.  Neurological: She is alert and oriented to person, place, and time.  Skin: Skin is warm and dry.  Psychiatric: She has a normal mood and affect.   Dg Chest 2 View  Result Date: 03/13/2017 CLINICAL DATA:  47 year old female with cough for 2 weeks and atypical chest pain and chest congestion. Nonsmoker. Initial encounter. EXAM: CHEST  2 VIEW COMPARISON:  12/26/2013 and 05/01/2011 chest x-ray. 12/27/2013 chest CT. FINDINGS: Significant scoliosis and distortion of the mediastinal  and cardiac silhouette with rotation of lung fields. Taking these limitations into account, no infiltrate or congestive heart failure is noted. Similar appearance of distorted mediastinal and cardiac silhouette. No plain film evidence of pulmonary malignancy. IMPRESSION: Significant scoliosis.  No active cardiopulmonary disease noted. Electronically Signed   By: Genia Del M.D.   On: 03/13/2017 18:37    Assessment and Plan :   Cough  Atypical chest pain  Shortness of breath  Influenza A  Will manage patient with Sudafed, Zyrtec, general supportive care. Return-to-clinic precautions discussed, patient verbalized understanding. Patient declined cough syrup, steroid course for cough.   Jaynee Eagles, [place] 03/13/17 1851

## 2017-03-13 NOTE — ED Triage Notes (Signed)
The patient presented to the [facility] with a complaint of a cough, sob, chest soreness x 2 weeks. The patient reported using Tamiflu and did complete the course prescribed.

## 2017-04-17 ENCOUNTER — Other Ambulatory Visit: Payer: Self-pay

## 2017-04-17 ENCOUNTER — Emergency Department (HOSPITAL_COMMUNITY)
Admission: EM | Admit: 2017-04-17 | Discharge: 2017-04-17 | Disposition: A | Discharge status: Home / Self Care | Payer: BLUE CROSS/BLUE SHIELD | Attending: Physician Assistant | Admitting: Physician Assistant

## 2017-04-17 ENCOUNTER — Encounter (HOSPITAL_COMMUNITY): Payer: Self-pay | Admitting: Emergency Medicine

## 2017-04-17 DIAGNOSIS — I159 Secondary hypertension, unspecified: Secondary | ICD-10-CM

## 2017-04-17 DIAGNOSIS — R51 Headache: Secondary | ICD-10-CM | POA: Diagnosis not present

## 2017-04-17 DIAGNOSIS — Z79899 Other long term (current) drug therapy: Secondary | ICD-10-CM | POA: Insufficient documentation

## 2017-04-17 DIAGNOSIS — R519 Headache, unspecified: Secondary | ICD-10-CM

## 2017-04-17 LAB — CBC
HCT: 36.3 % (ref 36.0–46.0)
Hemoglobin: 12 g/dL (ref 12.0–15.0)
MCH: 27.1 pg (ref 26.0–34.0)
MCHC: 33.1 g/dL (ref 30.0–36.0)
MCV: 81.9 fL (ref 78.0–100.0)
Platelets: 297 10*3/uL (ref 150–400)
RBC: 4.43 MIL/uL (ref 3.87–5.11)
RDW: 14.3 % (ref 11.5–15.5)
WBC: 5.9 10*3/uL (ref 4.0–10.5)

## 2017-04-17 LAB — COMPREHENSIVE METABOLIC PANEL
ALK PHOS: 75 U/L (ref 38–126)
ALT: 11 U/L — ABNORMAL LOW (ref 14–54)
ANION GAP: 8 (ref 5–15)
AST: 13 U/L — AB (ref 15–41)
Albumin: 3.9 g/dL (ref 3.5–5.0)
BILIRUBIN TOTAL: 0.3 mg/dL (ref 0.3–1.2)
BUN: 8 mg/dL (ref 6–20)
CALCIUM: 9.1 mg/dL (ref 8.9–10.3)
CO2: 25 mmol/L (ref 22–32)
Chloride: 105 mmol/L (ref 101–111)
Creatinine, Ser: 0.75 mg/dL (ref 0.44–1.00)
GFR calc Af Amer: >60 mL/min (ref >60)
GLUCOSE: 110 mg/dL — AB (ref 65–99)
Potassium: 4 mmol/L (ref 3.5–5.1)
Sodium: 138 mmol/L (ref 135–145)
TOTAL PROTEIN: 7.7 g/dL (ref 6.5–8.1)

## 2017-04-17 LAB — URINALYSIS, ROUTINE W REFLEX MICROSCOPIC
BILIRUBIN URINE: NEGATIVE
Glucose, UA: NEGATIVE mg/dL
Hgb urine dipstick: NEGATIVE
KETONES UR: NEGATIVE mg/dL
LEUKOCYTES UA: NEGATIVE
NITRITE: NEGATIVE
PH: 7 (ref 5.0–8.0)
PROTEIN: NEGATIVE mg/dL
Specific Gravity, Urine: 1.008 (ref 1.005–1.030)

## 2017-04-17 LAB — I-STAT BETA HCG BLOOD, ED (MC, WL, AP ONLY)

## 2017-04-17 LAB — LIPASE, BLOOD: Lipase: 28 U/L (ref 11–51)

## 2017-04-17 MED ORDER — IBUPROFEN 400 MG PO TABS
400.0000 mg | ORAL_TABLET | Freq: Once | ORAL | Status: AC | PRN
  Administered 2017-04-17: 400 mg via ORAL
  Filled 2017-04-17: qty 1

## 2017-04-17 MED ORDER — HYDROCHLOROTHIAZIDE 25 MG PO TABS: 25.0000 mg | ORAL_TABLET | Freq: Every day | ORAL | 0 refills | Status: DC

## 2017-04-17 MED ORDER — ONDANSETRON 4 MG PO TBDP: 4.0000 mg | ORAL_TABLET | Freq: Once | ORAL | Status: DC | PRN

## 2017-04-17 NOTE — Discharge Instructions (Addendum)
Take Tylenol or Motrin for your headache.  Measure your blood pressure daily.  Follow DASH diet. Exercise. If continues to remain high, please start taking blood pressure medications and follow-up with your doctor.

## 2017-04-17 NOTE — ED Triage Notes (Signed)
Pt reports a headache which for a few hours, and a "throbbing neck pain at the base of my shoulders."  Pt has had emesis and dizziness.  BP is 189/110.

## 2017-04-17 NOTE — ED Provider Notes (Signed)
[place] EMERGENCY DEPARTMENT Provider Note   CSN: [id number] Arrival date & time: 04/17/17  0036     History   Chief Complaint Chief Complaint  Patient presents with  . Emesis  . Headache    HPI Sonya Aguilar is a 47 y.o. female.  HPI Sonya Aguilar is a 47 y.o. female with no medical problems, presents to ED with complaint of a headache. Pt states she has developed gradual in onset frontal headache. States it began this afternoon. Had some neck stiffness prior to the headache. States she did not try anything for her pain at  Home. States she had associated photophobia, dizziness, and several episodes of emesis. Denies speech problems. No difficulty walking or moving extremities. States similar episode few weeks ago, was told her BP was 462 systolic. Pt denies hx of htn. Denies having been placed on BP medications in the past. Does admit her BP was high two weeks ago when went to OB/GYN.   History reviewed. No pertinent past medical history.  Patient Active Problem List   Diagnosis Date Noted  . Bilateral knee pain 10/04/2011    Past Surgical History:  Procedure Laterality Date  . CESAREAN SECTION       OB History   None      Home Medications    Prior to Admission medications   Medication Sig Start Date End Date Taking? Authorizing Provider  acetaminophen (TYLENOL) 500 MG tablet Take 500 mg by mouth every 6 (six) hours as needed. For tooth pain    [provider]  cetirizine (ZYRTEC ALLERGY) 10 MG tablet Take 1 tablet (10 mg total) by mouth daily. 03/13/17   Jaynee Eagles, PA-C  pseudoephedrine (SUDAFED) 60 MG tablet Take 1 tablet (60 mg total) by mouth every 8 (eight) hours as needed for congestion. 03/13/17   Jaynee Eagles, PA-C    Family History Family History  Problem Relation Age of Onset  . Hypertension Mother   . Hypertension Father   . Diabetes Father     Social History Social History   Tobacco Use  . Smoking  status: Never Smoker  . Smokeless tobacco: Never Used  Substance Use Topics  . Alcohol use: No    Alcohol/week: 0.0 oz  . Drug use: No     Allergies   Patient has no known allergies.   Review of Systems Review of Systems  Constitutional: Negative for chills and fever.  HENT: Negative for congestion.   Eyes: Positive for photophobia.  Respiratory: Negative for cough, chest tightness and shortness of breath.   Cardiovascular: Negative for chest pain, palpitations and leg swelling.  Gastrointestinal: Positive for nausea and vomiting. Negative for abdominal pain and diarrhea.  Genitourinary: Negative for dysuria, flank pain, pelvic pain, vaginal bleeding, vaginal discharge and vaginal pain.  Musculoskeletal: Negative for arthralgias, myalgias, neck pain and neck stiffness.  Skin: Negative for rash.  Neurological: Positive for dizziness, light-headedness and headaches. Negative for speech difficulty, weakness and numbness.  All other systems reviewed and are negative.    Physical Exam Updated Vital Signs BP (!) 189/110 (BP Location: Right Arm)   Pulse 62   Temp 97.7 F (36.5 C) (Oral)   Resp 16   SpO2 100%   Physical Exam  Constitutional: She is oriented to person, place, and time. She appears well-developed and well-nourished. No distress.  HENT:  Head: Normocephalic and atraumatic.  Eyes: Pupils are equal, round, and reactive to light. Conjunctivae and EOM are normal.  Neck: Normal range of motion. Neck supple.  Cardiovascular: Normal rate, regular rhythm and normal heart sounds.  Pulmonary/Chest: Effort normal and breath sounds normal. No respiratory distress. She has no wheezes. She has no rales.  Abdominal: Soft. Bowel sounds are normal. She exhibits no distension. There is no tenderness. There is no rebound.  Musculoskeletal: She exhibits no edema.  Neurological: She is alert and oriented to person, place, and time.  5/5 and equal upper and lower extremity strength  bilaterally. Equal grip strength bilaterally. Normal finger to nose and heel to shin. No pronator drift.   Skin: Skin is warm and dry.  Psychiatric: She has a normal mood and affect. Her behavior is normal.  Nursing note and vitals reviewed.    ED Treatments / Results  Labs (all labs ordered are listed, but only abnormal results are displayed) Labs Reviewed  COMPREHENSIVE METABOLIC PANEL - Abnormal; Notable for the following components:      Result Value   Glucose, Bld 110 (*)    AST 13 (*)    ALT 11 (*)    All other components within normal limits  URINALYSIS, ROUTINE W REFLEX MICROSCOPIC - Abnormal; Notable for the following components:   Color, Urine STRAW (*)    All other components within normal limits  LIPASE, BLOOD  CBC  I-STAT BETA HCG BLOOD, ED ([facility], [facility], [facility] ONLY)    EKG None  ED ECG REPORT   Date: 04/17/2017  Rate: 62  Rhythm: normal sinus rhythm  QRS Axis: normal  Intervals: normal  ST/T Wave abnormalities: normal  Conduction Disutrbances:none  Narrative Interpretation:   Old EKG Reviewed: none available  I have personally reviewed the EKG tracing and agree with the computerized printout as noted.  Radiology No results found.  Procedures Procedures (including critical care time)  Medications Ordered in ED Medications  ondansetron (ZOFRAN-ODT) disintegrating tablet 4 mg (has no administration in time range)  ibuprofen (ADVIL,MOTRIN) tablet 400 mg (400 mg Oral Given 04/17/17 0156)     Initial Impression / Assessment and Plan / ED Course  I have reviewed the triage vital signs and the nursing notes.  Pertinent labs & imaging results that were available during my care of the patient were reviewed by me and considered in my medical decision making (see chart for details).     Pt in ED with headache. BP elevated, 189/110. Pt received tylenol in triage and feels much better. States headache practically resolved and does not need any more medications.  Denies any more nausea. Labs normal. Will add ECG and recheck BP.   6:09 AM BP rechecked, improved at 159/97. ECG unremarkable.  Discussed better blood pressure control.  Offered to start patient on blood pressure medications.  Patient has been consistently high every time she is at urgent care or ED.  Patient is hesitant to start on any medications.  She would like to try diet and exercise, however  we did decide that I was going to give her prescription and she will keep an eye on her blood pressure and take it if it is consistently high.  She will follow-up with family doctor.  Vitals:   04/17/17 0121 04/17/17 0601  BP: (!) 189/110 (!) 159/97  Pulse: 62   Resp: 16   Temp: 97.7 F (36.5 C)   TempSrc: Oral   SpO2: 100%      Final Clinical Impressions(s) / ED Diagnoses   Final diagnoses:  None    ED Discharge Orders  None       Jeannett Senior, PA-C 04/17/17 1884    Macarthur Critchley, MD 04/19/17 1555

## 2017-08-17 ENCOUNTER — Emergency Department (HOSPITAL_COMMUNITY)
Admission: EM | Admit: 2017-08-17 | Discharge: 2017-08-17 | Disposition: A | Discharge status: Home / Self Care | Payer: BLUE CROSS/BLUE SHIELD | Attending: Emergency Medicine | Admitting: Emergency Medicine

## 2017-08-17 ENCOUNTER — Other Ambulatory Visit: Payer: Self-pay

## 2017-08-17 ENCOUNTER — Emergency Department (HOSPITAL_COMMUNITY): Payer: BLUE CROSS/BLUE SHIELD

## 2017-08-17 ENCOUNTER — Encounter (HOSPITAL_COMMUNITY): Payer: Self-pay

## 2017-08-17 DIAGNOSIS — Z79899 Other long term (current) drug therapy: Secondary | ICD-10-CM | POA: Insufficient documentation

## 2017-08-17 DIAGNOSIS — R0602 Shortness of breath: Secondary | ICD-10-CM | POA: Insufficient documentation

## 2017-08-17 DIAGNOSIS — M545 Low back pain: Secondary | ICD-10-CM | POA: Insufficient documentation

## 2017-08-17 DIAGNOSIS — M62838 Other muscle spasm: Secondary | ICD-10-CM | POA: Diagnosis not present

## 2017-08-17 DIAGNOSIS — R079 Chest pain, unspecified: Secondary | ICD-10-CM | POA: Insufficient documentation

## 2017-08-17 LAB — I-STAT BETA HCG BLOOD, ED (MC, WL, AP ONLY): I-stat hCG, quantitative: <5 m[IU]/mL (ref <5)

## 2017-08-17 LAB — I-STAT TROPONIN, ED
Troponin i, poc: 0.01 ng/mL (ref 0.00–0.08)
Troponin i, poc: 0 ng/mL (ref 0.00–0.08)

## 2017-08-17 LAB — CBC
HEMATOCRIT: 36.7 % (ref 36.0–46.0)
HEMOGLOBIN: 11.3 g/dL — AB (ref 12.0–15.0)
MCH: 26.3 pg (ref 26.0–34.0)
MCHC: 30.8 g/dL (ref 30.0–36.0)
MCV: 85.3 fL (ref 78.0–100.0)
Platelets: 322 10*3/uL (ref 150–400)
RBC: 4.3 MIL/uL (ref 3.87–5.11)
RDW: 14 % (ref 11.5–15.5)
WBC: 7.1 10*3/uL (ref 4.0–10.5)

## 2017-08-17 LAB — BASIC METABOLIC PANEL
Anion gap: 11 (ref 5–15)
BUN: 12 mg/dL (ref 6–20)
CO2: 24 mmol/L (ref 22–32)
Calcium: 9 mg/dL (ref 8.9–10.3)
Chloride: 101 mmol/L (ref 98–111)
Creatinine, Ser: 0.78 mg/dL (ref 0.44–1.00)
GFR calc Af Amer: >60 mL/min (ref >60)
GFR calc non Af Amer: >60 mL/min (ref >60)
GLUCOSE: 106 mg/dL — AB (ref 70–99)
POTASSIUM: 3.6 mmol/L (ref 3.5–5.1)
Sodium: 136 mmol/L (ref 135–145)

## 2017-08-17 LAB — RAPID URINE DRUG SCREEN, HOSP PERFORMED
AMPHETAMINES: NOT DETECTED
Barbiturates: NOT DETECTED
Benzodiazepines: NOT DETECTED
COCAINE: NOT DETECTED
OPIATES: NOT DETECTED
Tetrahydrocannabinol: NOT DETECTED

## 2017-08-17 LAB — D-DIMER, QUANTITATIVE: D-Dimer, Quant: 0.48 ug/mL-FEU (ref 0.00–0.50)

## 2017-08-17 MED ORDER — KETOROLAC TROMETHAMINE 60 MG/2ML IM SOLN: 60.0000 mg | Freq: Once | INTRAMUSCULAR | Status: DC

## 2017-08-17 MED ORDER — GI COCKTAIL ~~LOC~~
30.0000 mL | Freq: Once | ORAL | Status: AC
  Administered 2017-08-17: 30 mL via ORAL
  Filled 2017-08-17: qty 30

## 2017-08-17 MED ORDER — NAPROXEN 500 MG PO TABS: 500.0000 mg | ORAL_TABLET | Freq: Two times a day (BID) | ORAL | 0 refills | Status: DC

## 2017-08-17 MED ORDER — METHOCARBAMOL 500 MG PO TABS: 500.0000 mg | ORAL_TABLET | Freq: Two times a day (BID) | ORAL | 0 refills | Status: DC

## 2017-08-17 NOTE — ED Triage Notes (Signed)
Pt states that this afternoon she began to have back pain that then went to her central chest tightness, with palpations, radiation to neck, SOB, nausea.

## 2017-08-17 NOTE — ED Provider Notes (Addendum)
[facility] DEPARTMENT Provider Note   CSN: [id number] Arrival date & time: 08/17/17  0114     History   Chief Complaint Chief Complaint  Patient presents with  . Chest Pain    HPI Sonya Aguilar is a 47 y.o. female.  The history is provided by the patient.  Chest Pain   This is a new problem. The current episode started yesterday. The problem occurs constantly. The problem has not changed since onset.The pain is associated with raising an arm (started in low back and wrapped around to the front). The pain is present in the lateral region (Bilaterally). The pain is at a severity of 5/10. The pain is moderate. The quality of the pain is described as dull. Associated symptoms include back pain. Pertinent negatives include no abdominal pain, no claudication, no cough, no diaphoresis, no exertional chest pressure, no hemoptysis, no irregular heartbeat, no leg pain, no lower extremity edema, no nausea, no near-syncope, no numbness, no palpitations, no shortness of breath, no sputum production, no syncope, no vomiting and no weakness. She has tried nothing for the symptoms. The treatment provided no relief. There are no known risk factors.  Pertinent negatives for past medical history include no Marfan's syndrome.  Pertinent negatives for family medical history include: no Marfan's syndrome.  Procedure history is negative for cardiac catheterization.  started as low back spasm then went up the back to the neck and radiated around B.  No DOE no exertional symptoms.  No n/v/d.  No leg pain or swelling.   History reviewed. No pertinent past medical history.  Patient Active Problem List   Diagnosis Date Noted  . Bilateral knee pain 10/04/2011    Past Surgical History:  Procedure Laterality Date  . CESAREAN SECTION       OB History   None      Home Medications    Prior to Admission medications   Medication Sig Start Date End Date Taking? Authorizing  Provider  cetirizine (ZYRTEC ALLERGY) 10 MG tablet Take 1 tablet (10 mg total) by mouth daily. Patient not taking: Reported on 04/17/2017 03/13/17   Jaynee Eagles, PA-C  hydrochlorothiazide (HYDRODIURIL) 25 MG tablet Take 1 tablet (25 mg total) by mouth daily. 04/17/17   Kirichenko, Lahoma Rocker, PA-C  pseudoephedrine (SUDAFED) 60 MG tablet Take 1 tablet (60 mg total) by mouth every 8 (eight) hours as needed for congestion. Patient not taking: Reported on 04/17/2017 03/13/17   Jaynee Eagles, PA-C    Family History Family History  Problem Relation Age of Onset  . Hypertension Mother   . Hypertension Father   . Diabetes Father     Social History Social History   Tobacco Use  . Smoking status: Never Smoker  . Smokeless tobacco: Never Used  Substance Use Topics  . Alcohol use: No    Alcohol/week: 0.0 oz  . Drug use: No     Allergies   Patient has no known allergies.   Review of Systems Review of Systems  Constitutional: Negative for diaphoresis.  Respiratory: Negative for cough, hemoptysis, sputum production, shortness of breath and wheezing.   Cardiovascular: Positive for chest pain. Negative for palpitations, claudication, leg swelling, syncope and near-syncope.  Gastrointestinal: Negative for abdominal pain, nausea and vomiting.  Musculoskeletal: Positive for back pain. Negative for gait problem and joint swelling.  Neurological: Negative for weakness and numbness.  All other systems reviewed and are negative.    Physical Exam Updated Vital Signs BP (!) 158/98 (BP  Location: Right Arm)   Pulse (!) 106   Temp 98.6 F (37 C)   Resp 20   LMP 08/10/2017   SpO2 100%   Physical Exam  Constitutional: She is oriented to person, place, and time. She appears well-developed and well-nourished.  HENT:  Head: Normocephalic and atraumatic.  Mouth/Throat: No oropharyngeal exudate.  Eyes: Pupils are equal, round, and reactive to light. Conjunctivae are normal.  Neck: Normal range of  motion. Neck supple.  Cardiovascular: Normal rate, regular rhythm, normal heart sounds and intact distal pulses.  Pulmonary/Chest: Effort normal and breath sounds normal. No stridor. She has no wheezes. She has no rales.  Abdominal: Soft. Bowel sounds are normal. She exhibits no mass. There is no tenderness. There is no rebound and no guarding.  Musculoskeletal: Normal range of motion. She exhibits no edema or tenderness.  Neurological: She is alert and oriented to person, place, and time. She displays normal reflexes.  Skin: Skin is warm and dry. Capillary refill takes less than 2 seconds. She is not diaphoretic.  Psychiatric: She has a normal mood and affect.  Nursing note and vitals reviewed.    ED Treatments / Results  Labs (all labs ordered are listed, but only abnormal results are displayed) Results for orders placed or performed during the hospital encounter of 33/35/45  Basic metabolic panel  Result Value Ref Range   Sodium 136 135 - 145 mmol/L   Potassium 3.6 3.5 - 5.1 mmol/L   Chloride 101 98 - 111 mmol/L   CO2 24 22 - 32 mmol/L   Glucose, Bld 106 (H) 70 - 99 mg/dL   BUN 12 6 - 20 mg/dL   Creatinine, Ser 0.78 0.44 - 1.00 mg/dL   Calcium 9.0 8.9 - 10.3 mg/dL   GFR calc non Af Amer >60 >60 mL/min   GFR calc Af Amer >60 >60 mL/min   Anion gap 11 5 - 15  CBC  Result Value Ref Range   WBC 7.1 4.0 - 10.5 K/uL   RBC 4.30 3.87 - 5.11 MIL/uL   Hemoglobin 11.3 (L) 12.0 - 15.0 g/dL   HCT 36.7 36.0 - 46.0 %   MCV 85.3 78.0 - 100.0 fL   MCH 26.3 26.0 - 34.0 pg   MCHC 30.8 30.0 - 36.0 g/dL   RDW 14.0 11.5 - 15.5 %   Platelets 322 150 - 400 K/uL  D-dimer, quantitative (not at [facility])  Result Value Ref Range   D-Dimer, Quant 0.48 0.00 - 0.50 ug/mL-FEU  Rapid urine drug screen (hospital performed)  Result Value Ref Range   Opiates NONE DETECTED NONE DETECTED   Cocaine NONE DETECTED NONE DETECTED   Benzodiazepines NONE DETECTED NONE DETECTED   Amphetamines NONE DETECTED NONE  DETECTED   Tetrahydrocannabinol NONE DETECTED NONE DETECTED   Barbiturates NONE DETECTED NONE DETECTED  I-stat troponin, ED  Result Value Ref Range   Troponin i, poc 0.01 0.00 - 0.08 ng/mL   Comment 3          I-Stat beta hCG blood, ED  Result Value Ref Range   I-stat hCG, quantitative <5.0 <5 mIU/mL   Comment 3          I-stat troponin, ED  Result Value Ref Range   Troponin i, poc 0.00 0.00 - 0.08 ng/mL   Comment 3           Dg Chest 2 View  Result Date: 08/17/2017 CLINICAL DATA:  Mid chest pain tonight. EXAM: CHEST - 2 VIEW COMPARISON:  03/13/2017 FINDINGS: Thoracolumbar scoliosis with convexity towards the left in the upper thoracic region and towards the right in the thoracolumbar region. Normal heart size and pulmonary vascularity. No focal airspace disease or consolidation in the lungs. No blunting of costophrenic angles. No pneumothorax. Mediastinal contours appear intact. IMPRESSION: No evidence of active pulmonary disease. Thoracolumbar scoliosis. Electronically Signed   By: Lucienne Capers M.D.   On: 08/17/2017 01:47    EKG EKG Interpretation  Date/Time:  Thursday August 17 2017 01:17:54 EDT Ventricular Rate:  84 PR Interval:  168 QRS Duration: 76 QT Interval:  370 QTC Calculation: 437 R Axis:   29 Text Interpretation:  Normal sinus rhythm Cannot rule out Anteroseptal infarct , age undetermined Abnormal ECG When compared with ECG of 04/17/2017, No significant change was found Confirmed by Delora Fuel ([id number]) on 08/17/2017 1:46:30 AM   Radiology Dg Chest 2 View  Result Date: 08/17/2017 CLINICAL DATA:  Mid chest pain tonight. EXAM: CHEST - 2 VIEW COMPARISON:  03/13/2017 FINDINGS: Thoracolumbar scoliosis with convexity towards the left in the upper thoracic region and towards the right in the thoracolumbar region. Normal heart size and pulmonary vascularity. No focal airspace disease or consolidation in the lungs. No blunting of costophrenic angles. No pneumothorax.  Mediastinal contours appear intact. IMPRESSION: No evidence of active pulmonary disease. Thoracolumbar scoliosis. Electronically Signed   By: Lucienne Capers M.D.   On: 08/17/2017 01:47    Procedures Procedures (including critical care time)  Medications Ordered in ED Medications  ketorolac (TORADOL) injection 60 mg (has no administration in time range)  gi cocktail (Maalox,Lidocaine,Donnatal) (30 mLs Oral Given 08/17/17 0308)     Final Clinical Impressions(s) / ED Diagnoses     Ruled out for MI and PE in the ED.  HEART score is 1 very low risk for MACE.  Symptoms consistent with back spasm.  Will treat for same.  Follow up with your PMD for ongoing care.    Return for pain, numbness, changes in vision or speech, fevers >100.4 unrelieved by medication, shortness of breath, intractable vomiting, or diarrhea, abdominal pain, Inability to tolerate liquids or food, cough, altered mental status or any concerns. No signs of systemic illness or infection. The patient is nontoxic-appearing on exam and vital signs are within normal limits. Will refer to urology for microscopy hematuria as patient is asymptomatic.  I have reviewed the triage vital signs and the nursing notes. Pertinent labs &imaging results that were available during my care of the patient were reviewed by me and considered in my medical decision making (see chart for details).  After history, exam, and medical workup I feel the patient has been appropriately medically screened and is safe for discharge home. Pertinent diagnoses were discussed with the patient. Patient was given return precautions.   Rosamund Nyland, MD 08/17/17 [id number]    Veatrice Kells, MD 08/17/17 [id number]

## 2017-08-17 NOTE — ED Notes (Signed)
Pt complains of chest pressure and SOB that started while the pt was at work.

## 2017-11-01 ENCOUNTER — Other Ambulatory Visit: Payer: Self-pay | Admitting: Obstetrics and Gynecology

## 2017-11-01 DIAGNOSIS — Z8639 Personal history of other endocrine, nutritional and metabolic disease: Secondary | ICD-10-CM

## 2017-11-01 DIAGNOSIS — Z87898 Personal history of other specified conditions: Secondary | ICD-10-CM

## 2017-11-01 DIAGNOSIS — Z8742 Personal history of other diseases of the female genital tract: Secondary | ICD-10-CM

## 2017-11-27 ENCOUNTER — Ambulatory Visit
Admission: RE | Admit: 2017-11-27 | Discharge: 2017-11-27 | Disposition: A | Discharge status: Home / Self Care | Payer: BLUE CROSS/BLUE SHIELD | Source: Ambulatory Visit | Attending: Obstetrics and Gynecology | Admitting: Obstetrics and Gynecology

## 2017-11-27 DIAGNOSIS — Z8742 Personal history of other diseases of the female genital tract: Secondary | ICD-10-CM

## 2017-11-27 DIAGNOSIS — Z87898 Personal history of other specified conditions: Secondary | ICD-10-CM

## 2017-11-27 DIAGNOSIS — Z8639 Personal history of other endocrine, nutritional and metabolic disease: Secondary | ICD-10-CM

## 2018-09-11 DIAGNOSIS — N8003 Adenomyosis of the uterus: Secondary | ICD-10-CM | POA: Insufficient documentation

## 2018-12-19 DIAGNOSIS — I10 Essential (primary) hypertension: Secondary | ICD-10-CM | POA: Insufficient documentation

## 2019-03-19 DIAGNOSIS — R232 Flushing: Secondary | ICD-10-CM | POA: Insufficient documentation

## 2019-08-21 IMAGING — CR DG CHEST 2V
2 series · 2 of 2 positions shown · non-contrast
Comparison: 03/13/2017

CLINICAL DATA: Mid chest pain tonight.

EXAM:
CHEST - 2 VIEW

[chest pa]
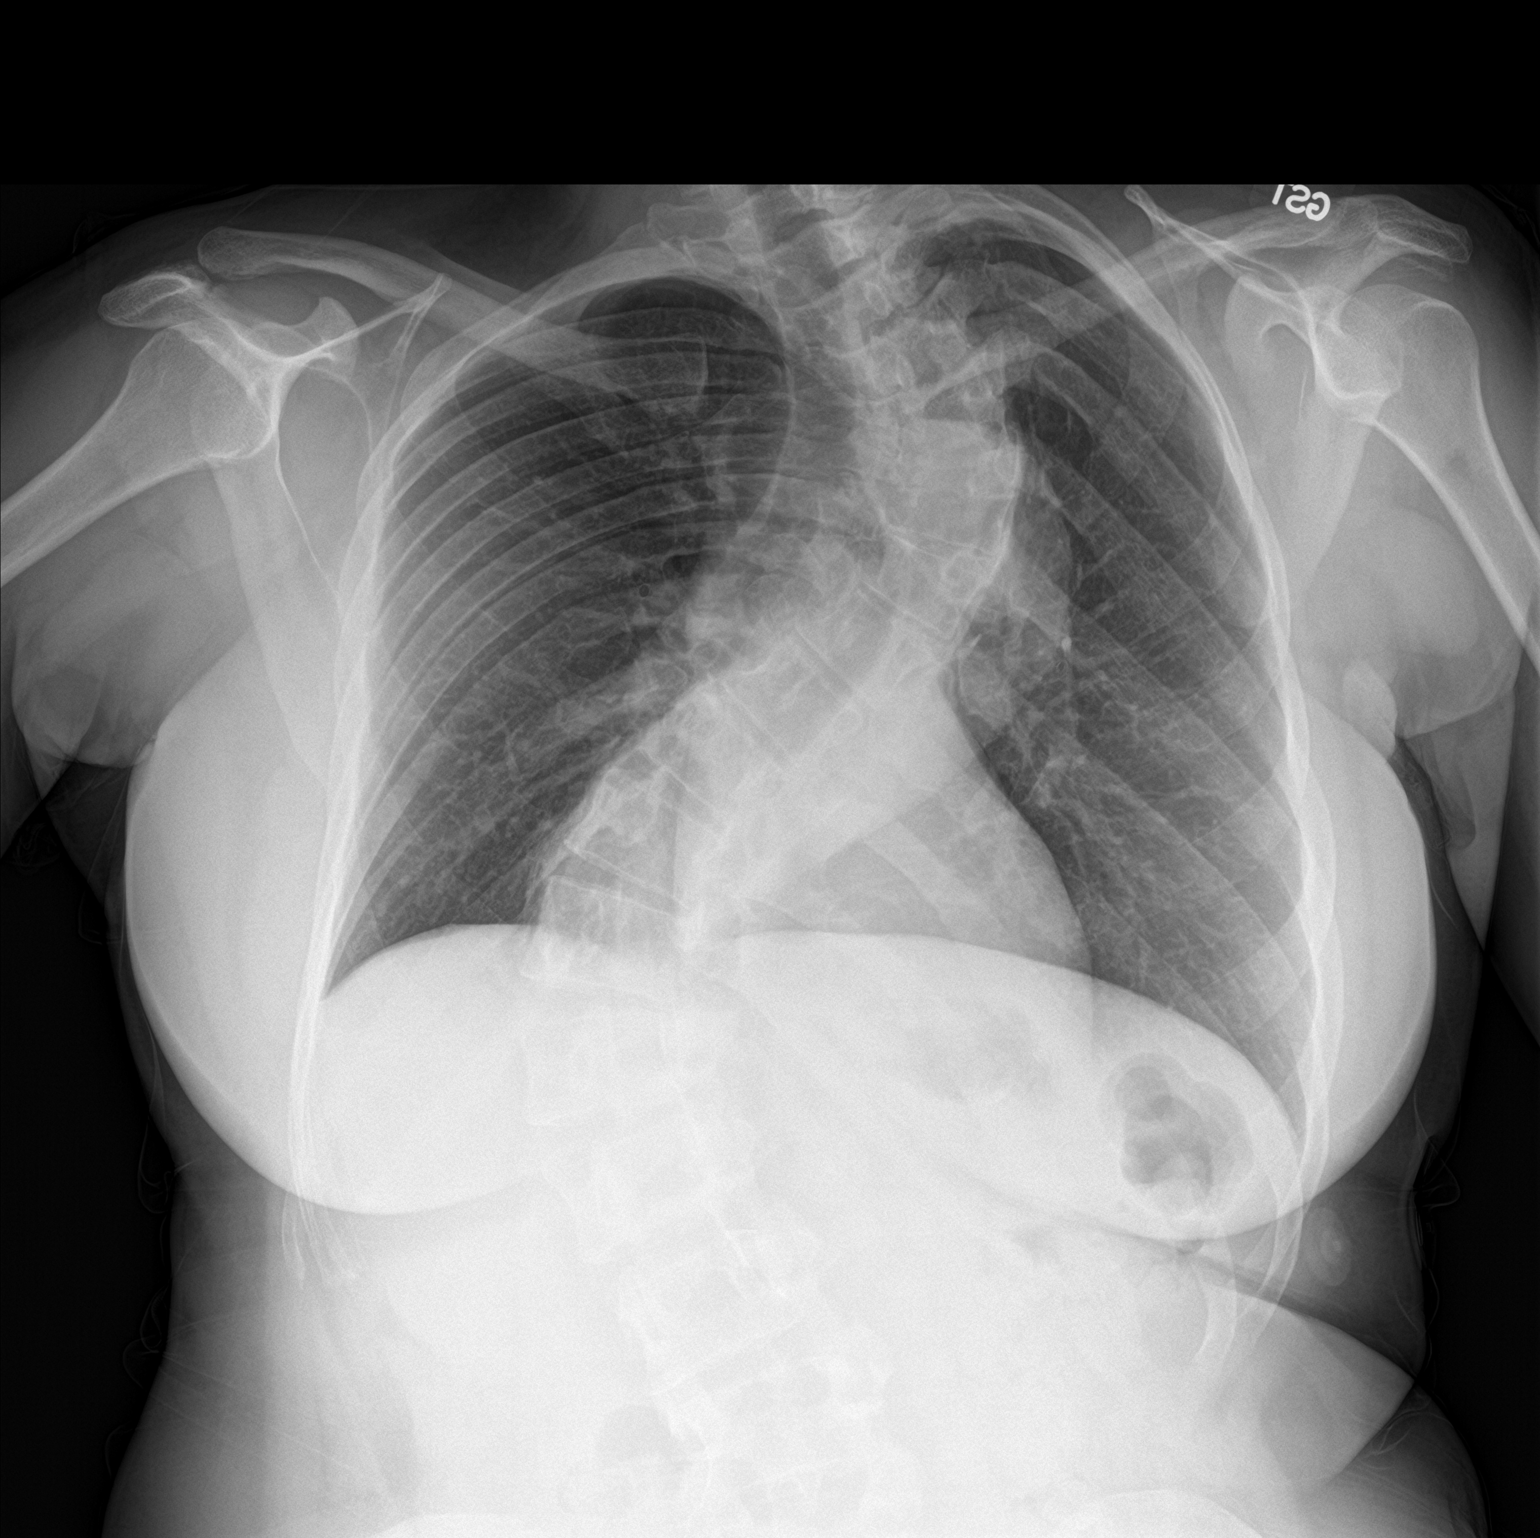

[chest lat]
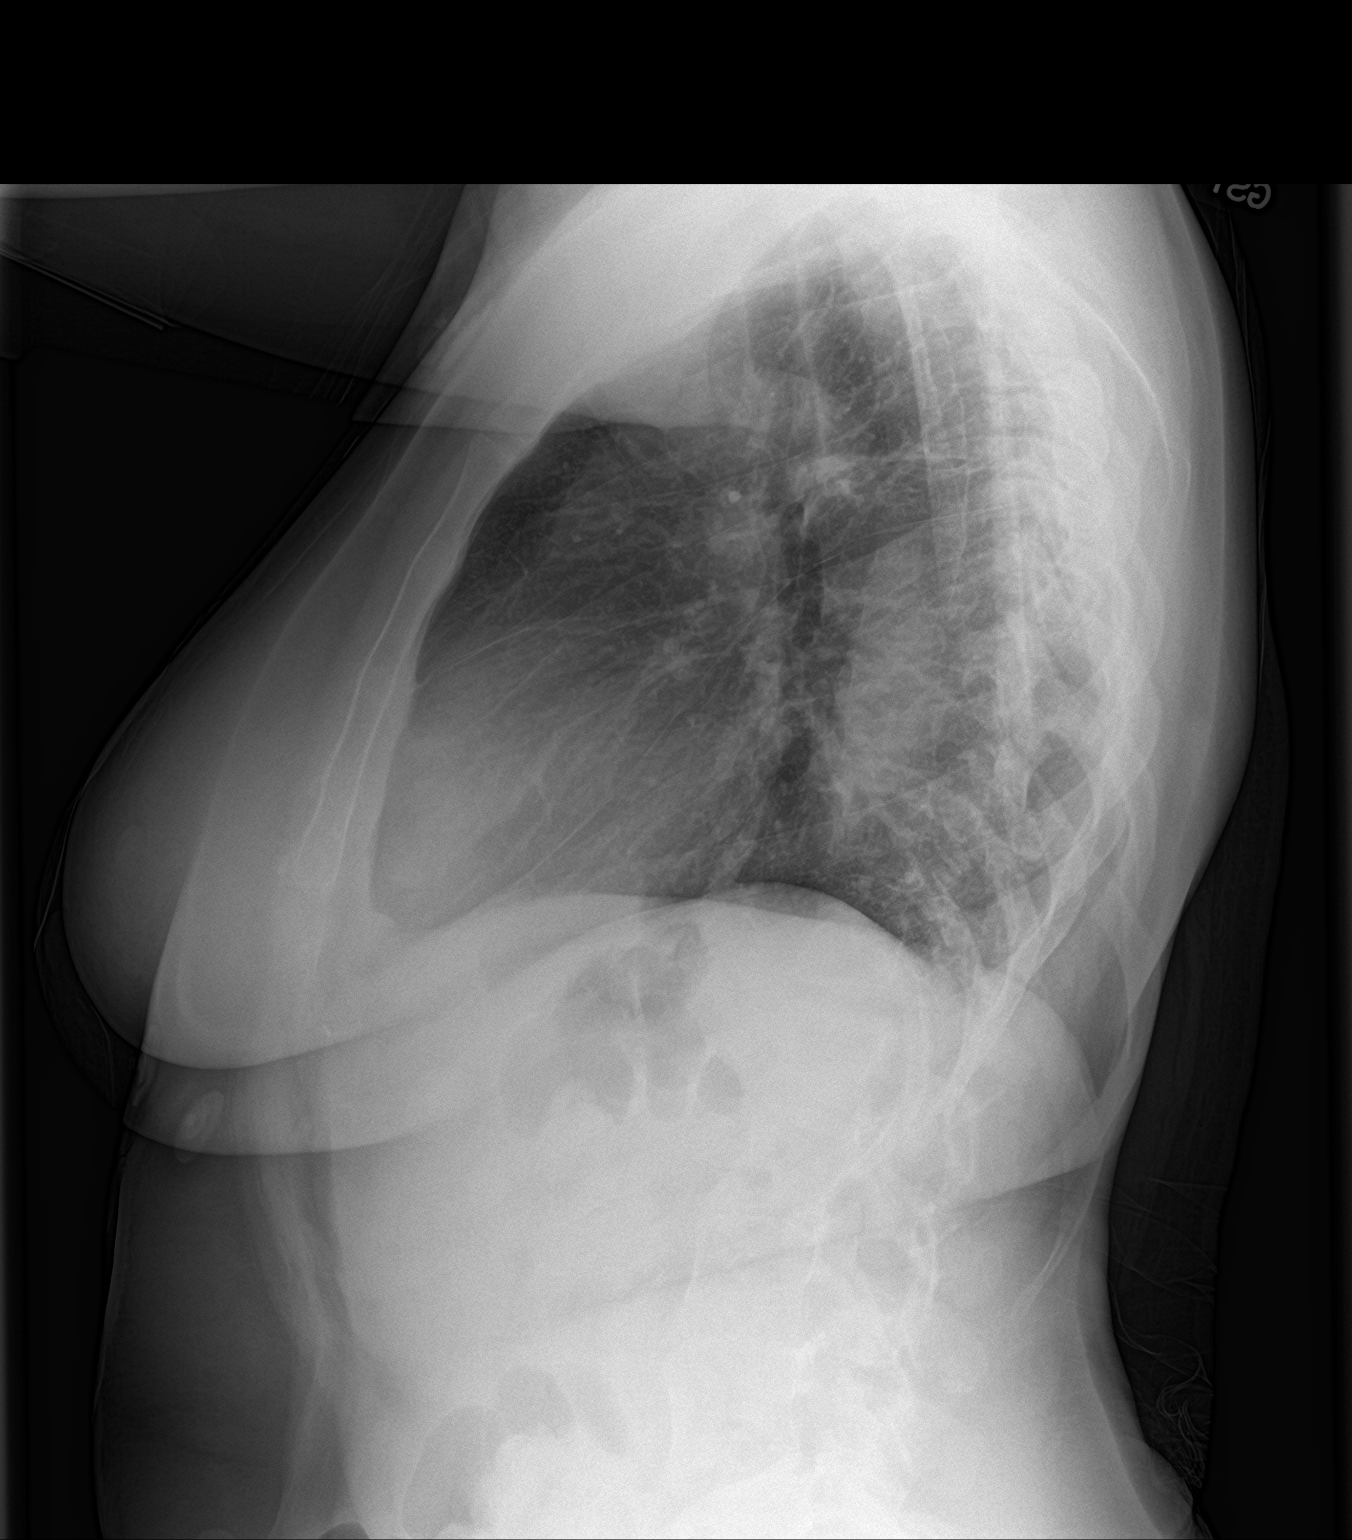

[2 of 2 positions shown; findings below may reference images not displayed]

FINDINGS: Thoracolumbar scoliosis with convexity towards the left in the upper
thoracic region and towards the right in the thoracolumbar region.
Normal heart size and pulmonary vascularity. No focal airspace
disease or consolidation in the lungs. No blunting of costophrenic
angles. No pneumothorax. Mediastinal contours appear intact.
IMPRESSION: No evidence of active pulmonary disease. Thoracolumbar scoliosis.

## 2019-12-11 ENCOUNTER — Ambulatory Visit: Payer: BLUE CROSS/BLUE SHIELD | Admitting: Family

## 2020-03-27 ENCOUNTER — Ambulatory Visit: Payer: 59 | Admitting: Family

## 2020-04-06 LAB — HM MAMMOGRAPHY

## 2020-04-13 ENCOUNTER — Encounter: Payer: Self-pay | Admitting: Family

## 2020-04-13 ENCOUNTER — Ambulatory Visit (INDEPENDENT_AMBULATORY_CARE_PROVIDER_SITE_OTHER): Payer: 59 | Admitting: Family

## 2020-04-13 ENCOUNTER — Other Ambulatory Visit: Payer: Self-pay

## 2020-04-13 VITALS — BP 132/80 | HR 63 | Temp 98.5°F | Ht 67.0 in | Wt 181.4 lb

## 2020-04-13 DIAGNOSIS — R300 Dysuria: Secondary | ICD-10-CM | POA: Diagnosis not present

## 2020-04-13 DIAGNOSIS — N951 Menopausal and female climacteric states: Secondary | ICD-10-CM

## 2020-04-13 DIAGNOSIS — G4700 Insomnia, unspecified: Secondary | ICD-10-CM | POA: Diagnosis not present

## 2020-04-13 DIAGNOSIS — I10 Essential (primary) hypertension: Secondary | ICD-10-CM

## 2020-04-13 MED ORDER — FLUCONAZOLE 150 MG PO TABS: ORAL_TABLET | ORAL | 0 refills | Status: DC

## 2020-04-13 NOTE — Patient Instructions (Signed)
Consider trying Melatonin to help you sleep- start with 1 mg and go up to 5 mg if needed;    https://www.womenshealth.gov/menopause/menopause-basics"> https://www.clinicalkey.com">  Menopause Menopause is the normal time of a woman's life when menstrual periods stop completely. It marks the natural end to a woman's ability to become pregnant. It can be defined as the absence of a menstrual period for 12 months without another medical cause. The transition to menopause (perimenopause) most often happens between the ages of 68 and 56, and can last for many years. During perimenopause, hormone levels change in your body, which can cause symptoms and affect your health. Menopause may increase your risk for:  Weakened bones (osteoporosis), which causes fractures.  Depression.  Hardening and narrowing of the arteries (atherosclerosis), which can cause heart attacks and strokes. What are the causes? This condition is usually caused by a natural change in hormone levels that happens as you get older. The condition may also be caused by changes that are not natural, including:  Surgery to remove both ovaries (surgical menopause).  Side effects from some medicines, such as chemotherapy used to treat cancer (chemical menopause). What increases the risk? This condition is more likely to start at an earlier age if you have certain medical conditions or have undergone treatments, including:  A tumor of the pituitary gland in the brain.  A disease that affects the ovaries and hormones.  Certain cancer treatments, such as chemotherapy or hormone therapy, or radiation therapy on the pelvis.  Heavy smoking and excessive alcohol use.  Family history of early menopause. This condition is also more likely to develop earlier in women who are very thin. What are the signs or symptoms? Symptoms of this condition include:  Hot flashes.  Irregular menstrual periods.  Night sweats.  Changes in feelings  about sex. This could be a decrease in sex drive or an increased discomfort around your sexuality.  Vaginal dryness and thinning of the vaginal walls. This may cause painful sex.  Dryness of the skin and development of wrinkles.  Headaches.  Problems sleeping (insomnia).  Mood swings or irritability.  Memory problems.  Weight gain.  Hair growth on the face and chest.  Bladder infections or problems with urinating. How is this diagnosed? This condition is diagnosed based on your medical history, a physical exam, your age, your menstrual history, and your symptoms. Hormone tests may also be done. How is this treated? In some cases, no treatment is needed. You and your health care provider should make a decision together about whether treatment is necessary. Treatment will be based on your individual condition and preferences. Treatment for this condition focuses on managing symptoms. Treatment may include:  Menopausal hormone therapy (MHT).  Medicines to treat specific symptoms or complications.  Acupuncture.  Vitamin or herbal supplements. Before starting treatment, make sure to let your health care provider know if you have a personal or family history of these conditions:  Heart disease.  Breast cancer.  Blood clots.  Diabetes.  Osteoporosis. Follow these instructions at home: Lifestyle  Do not use any products that contain nicotine or tobacco, such as cigarettes, e-cigarettes, and chewing tobacco. If you need help quitting, ask your health care provider.  Get at least 30 minutes of physical activity on 5 or more days each week.  Avoid alcoholic and caffeinated beverages, as well as spicy foods. This may help prevent hot flashes.  Get 7-8 hours of sleep each night.  If you have hot flashes, try: ? Dressing  in layers. ? Avoiding things that may trigger hot flashes, such as spicy food, warm places, or stress. ? Taking slow, deep breaths when a hot flash  starts. ? Keeping a fan in your home and office.  Find ways to manage stress, such as deep breathing, meditation, or journaling.  Consider going to group therapy with other women who are having menopause symptoms. Ask your health care provider about recommended group therapy meetings. Eating and drinking  Eat a healthy, balanced diet that contains whole grains, lean protein, low-fat dairy, and plenty of fruits and vegetables.  Your health care provider may recommend adding more soy to your diet. Foods that contain soy include tofu, tempeh, and soy milk.  Eat plenty of foods that contain calcium and vitamin D for bone health. Items that are rich in calcium include low-fat milk, yogurt, beans, almonds, sardines, broccoli, and kale.   Medicines  Take over-the-counter and prescription medicines only as told by your health care provider.  Talk with your health care provider before starting any herbal supplements. If prescribed, take vitamins and supplements as told by your health care provider. General instructions  Keep track of your menstrual periods, including: ? When they occur. ? How heavy they are and how long they last. ? How much time passes between periods.  Keep track of your symptoms, noting when they start, how often you have them, and how long they last.  Use vaginal lubricants or moisturizers to help with vaginal dryness and improve comfort during sex.  Keep all follow-up visits. This is important. This includes any group therapy or counseling.   Contact a health care provider if:  You are still having menstrual periods after age 50.  You have pain during sex.  You have not had a period for 12 months and you develop vaginal bleeding. Get help right away if you have:  Severe depression.  Excessive vaginal bleeding.  Pain when you urinate.  A fast or irregular heartbeat (palpitations).  Severe headaches.  Abdominal pain or severe  indigestion. Summary  Menopause is a normal time of life when menstrual periods stop completely. It is usually defined as the absence of a menstrual period for 12 months without another medical cause.  The transition to menopause (perimenopause) most often happens between the ages of 38 and 62 and can last for several years.  Symptoms can be managed through medicines, lifestyle changes, and complementary therapies such as acupuncture.  Eat a balanced diet that is rich in nutrients to promote bone health and heart health and to manage symptoms during menopause. This information is not intended to replace advice given to you by your health care provider. Make sure you discuss any questions you have with your health care provider. Document Revised: 10/11/2019 Document Reviewed: 06/27/2019 Elsevier Patient Education  [employer].

## 2020-04-13 NOTE — Progress Notes (Signed)
Sonya Aguilar is a 50 y.o. female with the following history as recorded in Epic[employer]:  Patient Active Problem List   Diagnosis Date Noted  . Bilateral knee pain 10/04/2011    Current Outpatient Medications  Medication Sig Dispense Refill  . amLODipine (NORVASC) 2.5 MG tablet Take 2.5 mg by mouth daily.    . folic acid (FOLVITE) 1 MG tablet Take 1 mg by mouth daily.    . NON FORMULARY Take by mouth. Hair Skin and Nails    . fluconazole (DIFLUCAN) 150 MG tablet Take one tablet as directed; repeat after 72 hours 2 tablet 0   No current facility-administered medications for this visit.    Allergies: Patient has no known allergies.  Past Medical History:  Diagnosis Date  . Heart murmur    years ago  . Hypertension     Past Surgical History:  Procedure Laterality Date  . CESAREAN SECTION    . CESAREAN SECTION     2000  . miscarriage     2012    Family History  Problem Relation Age of Onset  . Hypertension Mother   . Hypertension Father   . Diabetes Father   . Cancer Father        prostate    Social History   Tobacco Use  . Smoking status: Never Smoker  . Smokeless tobacco: Never Used  Substance Use Topics  . Alcohol use: No    Alcohol/week: 0.0 standard drinks    Subjective:   Patient presents today as a new patient to establish new PCP; Sees GYN regularly- [facility]; yearly exam is up to date including fasting labs in July 2021; majority of her healthcare needs are managed by her GYN;   History of hypertension- stable on Amlodipine 2.5 mg daily;   LMP- started today; irregular;   Has been having menopausal symptoms for the past few years; not interested in starting any type of prescriptive medication at this time;   Notes that she does not sleep well- not a new issue for her; would like to try natural option first;   Feels like she has a yeast infection today- requesting treatment    Objective:     Vitals:   04/13/20 0952  BP: 132/80   Pulse: 63  Temp: 98.5 F (36.9 C)  TempSrc: Oral  SpO2: 99%  Weight: 181 lb 6.4 oz (82.3 kg)  Height: 5\' 7"  (1.702 m)    General: Well developed, well nourished, in no acute distress  Skin : Warm and dry.  Head: Normocephalic and atraumatic  Eyes: Sclera and conjunctiva clear; pupils round and reactive to light; extraocular movements intact  Ears: External normal; canals clear; tympanic membranes normal  Oropharynx: Pink, supple. No suspicious lesions  Neck: Supple without thyromegaly, adenopathy  Lungs: Respirations unlabored; clear to auscultation bilaterally without wheeze, rales, rhonchi  CVS exam: normal rate and regular rhythm.  Abdomen: Soft; nontender; nondistended; normoactive bowel sounds; no masses or hepatosplenomegaly  Musculoskeletal: No deformities; no active joint inflammation  Extremities: No edema, cyanosis, clubbing  Vessels: Symmetric bilaterally  Neurologic: Alert and oriented; speech intact; face symmetrical; moves all extremities well; CNII-XII intact without focal deficit   Assessment:  1. Primary hypertension   2. Dysuria   3. Insomnia, unspecified type   4. Menopausal symptoms     Plan:  1. Stable; continue Amlodipine 2.5 mg daily; she defers checking labs here today- having done yearly with her GYN; 2. Suspect yeast infection- check urine culture; Rx  for Diflucan- take as directed; 3. Trial of OTC Melatonin; 4. Defers medication- she will discuss further with her GYN if she decides she would like to consider HRT; she is encouraged to make sure she sees her GYN as scheduled for later this summer.  This visit occurred during the SARS-CoV-2 public health emergency.  Safety protocols were in place, including screening questions prior to the visit, additional usage of staff PPE, and extensive cleaning of exam room while observing appropriate contact time as indicated for disinfecting solutions.      No follow-ups on file.  Orders Placed This Encounter   Procedures  . Urine Culture    Standing Status:   Future    Number of Occurrences:   1    Standing Expiration Date:   04/13/2021    Requested Prescriptions   Signed Prescriptions Disp Refills  . fluconazole (DIFLUCAN) 150 MG tablet 2 tablet 0    Sig: Take one tablet as directed; repeat after 72 hours

## 2020-04-14 LAB — URINE CULTURE: Result:: NO GROWTH

## 2020-04-15 ENCOUNTER — Telehealth: Payer: Self-pay

## 2020-04-15 NOTE — Telephone Encounter (Signed)
Pt called and the results given to her.

## 2020-06-18 ENCOUNTER — Ambulatory Visit (INDEPENDENT_AMBULATORY_CARE_PROVIDER_SITE_OTHER): Payer: 59 | Admitting: Family

## 2020-06-18 ENCOUNTER — Encounter: Payer: Self-pay | Admitting: Family

## 2020-06-18 ENCOUNTER — Other Ambulatory Visit: Payer: Self-pay

## 2020-06-18 VITALS — BP 150/92 | HR 70 | Temp 98.1°F | Ht 67.0 in | Wt 181.8 lb

## 2020-06-18 DIAGNOSIS — R200 Anesthesia of skin: Secondary | ICD-10-CM

## 2020-06-18 DIAGNOSIS — R202 Paresthesia of skin: Secondary | ICD-10-CM | POA: Diagnosis not present

## 2020-06-18 DIAGNOSIS — I10 Essential (primary) hypertension: Secondary | ICD-10-CM

## 2020-06-18 DIAGNOSIS — R7309 Other abnormal glucose: Secondary | ICD-10-CM

## 2020-06-18 LAB — COMPREHENSIVE METABOLIC PANEL
ALT: 9 U/L (ref 0–35)
AST: 8 U/L (ref 0–37)
Albumin: 4.4 g/dL (ref 3.5–5.2)
Alkaline Phosphatase: 89 U/L (ref 39–117)
BUN: 13 mg/dL (ref 6–23)
CO2: 30 mEq/L (ref 19–32)
Calcium: 9.7 mg/dL (ref 8.4–10.5)
Chloride: 104 mEq/L (ref 96–112)
Creatinine, Ser: 0.79 mg/dL (ref 0.40–1.20)
GFR: 86.75 mL/min (ref >60.00)
Glucose, Bld: 89 mg/dL (ref 70–99)
Potassium: 4.2 mEq/L (ref 3.5–5.1)
Sodium: 142 mEq/L (ref 135–145)
Total Bilirubin: 0.2 mg/dL (ref 0.2–1.2)
Total Protein: 8 g/dL (ref 6.0–8.3)

## 2020-06-18 LAB — POCT GLYCOSYLATED HEMOGLOBIN (HGB A1C): Hemoglobin A1C: 5.8 % — AB (ref 4.0–5.6)

## 2020-06-18 LAB — CBC WITH DIFFERENTIAL/PLATELET
Basophils Absolute: 0.1 10*3/uL (ref 0.0–0.1)
Basophils Relative: 1.1 % (ref 0.0–3.0)
Eosinophils Absolute: 0.1 10*3/uL (ref 0.0–0.7)
Eosinophils Relative: 2.2 % (ref 0.0–5.0)
HCT: 34.9 % — ABNORMAL LOW (ref 36.0–46.0)
Hemoglobin: 11.6 g/dL — ABNORMAL LOW (ref 12.0–15.0)
Lymphocytes Relative: 40.7 % (ref 12.0–46.0)
Lymphs Abs: 2.2 10*3/uL (ref 0.7–4.0)
MCHC: 33.1 g/dL (ref 30.0–36.0)
MCV: 81.3 fl (ref 78.0–100.0)
Monocytes Absolute: 0.4 10*3/uL (ref 0.1–1.0)
Monocytes Relative: 7.7 % (ref 3.0–12.0)
Neutro Abs: 2.6 10*3/uL (ref 1.4–7.7)
Neutrophils Relative %: 48.3 % (ref 43.0–77.0)
Platelets: 294 10*3/uL (ref 150.0–400.0)
RBC: 4.3 Mil/uL (ref 3.87–5.11)
RDW: 15.4 % (ref 11.5–15.5)
WBC: 5.4 10*3/uL (ref 4.0–10.5)

## 2020-06-18 LAB — VITAMIN B12: Vitamin B-12: 573 pg/mL (ref 211–911)

## 2020-06-18 NOTE — Patient Instructions (Signed)
Please call the oral surgeon today to ask about changing the Amoxicillin;  Take 2 Amlodipine ( 5 mg ) daily for your blood pressure;

## 2020-06-18 NOTE — Progress Notes (Signed)
Sonya Aguilar is a 50 y.o. female with the following history as recorded in EpicCare:  Patient Active Problem List   Diagnosis Date Noted  . Bilateral knee pain 10/04/2011    Current Outpatient Medications  Medication Sig Dispense Refill  . amLODipine (NORVASC) 2.5 MG tablet Take 2.5 mg by mouth daily.    Marland Kitchen amoxicillin (AMOXIL) 500 MG capsule Take 500 mg by mouth 3 (three) times daily.    . NON FORMULARY Take by mouth. Hair Skin and Nails    . Cholecalciferol (VITAMIN D) 50 MCG (2000 UT) tablet     . folic acid (FOLVITE) 401 MCG tablet      No current facility-administered medications for this visit.    Allergies: Oxycodone  Past Medical History:  Diagnosis Date  . Heart murmur    years ago  . Hypertension     Past Surgical History:  Procedure Laterality Date  . CESAREAN SECTION    . CESAREAN SECTION     2000  . miscarriage     2012    Family History  Problem Relation Age of Onset  . Hypertension Mother   . Hypertension Father   . Diabetes Father   . Cancer Father        prostate    Social History   Tobacco Use  . Smoking status: Never Smoker  . Smokeless tobacco: Never Used  Substance Use Topics  . Alcohol use: No    Alcohol/week: 0.0 standard drinks    Subjective:   Notes that in past few weeks she has been having increased problems with "hot flashes"/ blurred vision/ sensation of heat in his extremities; symptoms correlate with timing of complication from oral surgery- has had to have revision of original treatment; has been on Amoxicillin 500 mg tid since Monday and feels that this has aggravated her above symptoms;   Objective:  Vitals:   06/18/20 1122  BP: (!) 150/92  Pulse: 70  Temp: 98.1 F (36.7 C)  TempSrc: Oral  SpO2: 99%  Weight: 181 lb 12.8 oz (82.5 kg)  Height: '5\' 7"'  (1.702 m)    General: Well developed, well nourished, in no acute distress  Skin : Warm and dry.  Head: Normocephalic and atraumatic  Eyes: Sclera and conjunctiva  clear; pupils round and reactive to light; extraocular movements intact  Ears: External normal; canals clear; tympanic membranes normal  Oropharynx: Pink, supple. No suspicious lesions  Neck: Supple without thyromegaly, adenopathy  Lungs: Respirations unlabored; clear to auscultation bilaterally without wheeze, rales, rhonchi  CVS exam: normal rate and regular rhythm.  Musculoskeletal: No deformities; no active joint inflammation  Extremities: No edema, cyanosis, clubbing  Vessels: Symmetric bilaterally  Neurologic: Alert and oriented; speech intact; face symmetrical; moves all extremities well; CNII-XII intact without focal deficit   Assessment:  1. Elevated glucose   2. Numbness and tingling   3. Essential hypertension     Plan:  1. Hgba1c indicates pre-diabetes; explained this would not cause her symptoms but she needs to work on limiting her intake of refined sugars/ processed foods; 2. ? Reaction to Amoxicillin- she is asked to call her oral surgeon to get an alternative; check CBC, CMP, B12 today; re-check in 3 months- if symptoms persist, to consider neurology consult. 3. Uncontrolled- increase to Amlodipine to 5 mg daily;   This visit occurred during the SARS-CoV-2 public health emergency.  Safety protocols were in place, including screening questions prior to the visit, additional usage of staff PPE, and extensive  cleaning of exam room while observing appropriate contact time as indicated for disinfecting solutions.      Return in about 3 weeks (around 07/09/2020).  Orders Placed This Encounter  Procedures  . CBC with Differential/Platelet  . Comp Met (CMET)  . Vitamin B12  . POCT glycosylated hemoglobin (Hb A1C)    Requested Prescriptions    No prescriptions requested or ordered in this encounter

## 2020-06-29 ENCOUNTER — Ambulatory Visit (HOSPITAL_COMMUNITY)
Admission: EM | Admit: 2020-06-29 | Discharge: 2020-06-29 | Disposition: A | Discharge status: Home / Self Care | Payer: 59 | Attending: Family Medicine | Admitting: Family Medicine

## 2020-06-29 ENCOUNTER — Encounter (HOSPITAL_COMMUNITY): Payer: Self-pay

## 2020-06-29 DIAGNOSIS — M5431 Sciatica, right side: Secondary | ICD-10-CM

## 2020-06-29 LAB — POCT URINALYSIS DIPSTICK, ED / UC
Bilirubin Urine: NEGATIVE
Glucose, UA: NEGATIVE mg/dL
Hgb urine dipstick: NEGATIVE
Leukocytes,Ua: NEGATIVE
Nitrite: NEGATIVE
Protein, ur: NEGATIVE mg/dL
Specific Gravity, Urine: 1.025 (ref 1.005–1.030)
Urobilinogen, UA: 0.2 mg/dL (ref 0.0–1.0)
pH: 5.5 (ref 5.0–8.0)

## 2020-06-29 MED ORDER — PREDNISONE 20 MG PO TABS: 20.0000 mg | ORAL_TABLET | Freq: Every day | ORAL | 0 refills | Status: AC

## 2020-06-29 NOTE — ED Provider Notes (Signed)
[facility]    CSN: [id number] Arrival date & time: 06/29/20  1648      History   Chief Complaint Chief Complaint  Patient presents with  . Back Pain    HPI Sonya Aguilar is a 50 y.o. female.   HPI  Patient presents today with 1 week of localized right-sided low back pain.  The pain started rather mildly and has gradually progressed and is now shooting down her right buttocks.  No documented history of chronic low back pain.  She denies any injury.  She did recently have some dental work done however did not have any back pain following the procedure.  She was seen by her PCP a few days ago and had a normal UA.  Denies any urinary symptoms.  She has taken ibuprofen 800 mg which is reduced the pain however has not completely resolved the pain. Past Medical History:  Diagnosis Date  . Heart murmur    years ago  . Hypertension     Patient Active Problem List   Diagnosis Date Noted  . Bilateral knee pain 10/04/2011    Past Surgical History:  Procedure Laterality Date  . CESAREAN SECTION    . CESAREAN SECTION     2000  . miscarriage     2012    OB History   No obstetric history on file.      Home Medications    Prior to Admission medications   Medication Sig Start Date End Date Taking? Authorizing Provider  amLODipine (NORVASC) 2.5 MG tablet Take 2.5 mg by mouth daily. 03/31/20   [provider]  amoxicillin (AMOXIL) 500 MG capsule Take 500 mg by mouth 3 (three) times daily.    [provider]  Cholecalciferol (VITAMIN D) 50 MCG (2000 UT) tablet     [provider]  folic acid (FOLVITE) 188 MCG tablet     [provider]  NON FORMULARY Take by mouth. Hair Skin and Nails    [provider]    Family History Family History  Problem Relation Age of Onset  . Hypertension Mother   . Hypertension Father   . Diabetes Father   . Cancer Father        prostate    Social History Social History   Tobacco  Use  . Smoking status: Never Smoker  . Smokeless tobacco: Never Used  Vaping Use  . Vaping Use: Never used  Substance Use Topics  . Alcohol use: No    Alcohol/week: 0.0 standard drinks  . Drug use: No     Allergies   Oxycodone   Review of Systems Review of Systems Pertinent negatives listed in HPI  Physical Exam Triage Vital Signs ED Triage Vitals  Enc Vitals Group     BP 06/29/20 1803 137/68     Pulse Rate 06/29/20 1803 78     Resp 06/29/20 1803 17     Temp 06/29/20 1803 98.9 F (37.2 C)     Temp Source 06/29/20 1803 Oral     SpO2 06/29/20 1803 98 %     Weight --      Height --      Head Circumference --      Peak Flow --      Pain Score 06/29/20 1759 8     Pain Loc --      Pain Edu? --      Excl. in [place]? --    No data found.  Updated Vital  Signs BP 137/88   Pulse 78   Temp 98.9 F (37.2 C) (Oral)   Resp 17   LMP  (LMP Unknown)   SpO2 98%   Visual Acuity Right Eye Distance:   Left Eye Distance:   Bilateral Distance:    Right Eye Near:   Left Eye Near:    Bilateral Near:     Physical Exam Constitutional:      Appearance: Normal appearance.  HENT:     Head: Normocephalic.  Cardiovascular:     Rate and Rhythm: Normal rate and regular rhythm.  Pulmonary:     Effort: Pulmonary effort is normal.     Breath sounds: Normal breath sounds.  Musculoskeletal:       Arms:  Skin:    Capillary Refill: Capillary refill takes less than 2 seconds.  Neurological:     General: No focal deficit present.     Mental Status: She is alert.  Psychiatric:        Attention and Perception: Attention normal.        Mood and Affect: Mood normal.        Speech: Speech normal.        Behavior: Behavior normal. Behavior is cooperative.        Thought Content: Thought content normal.      UC Treatments / Results  Labs (all labs ordered are listed, but only abnormal results are displayed)   EKG   Radiology No results found.  Procedures Procedures  (including critical care time)  Medications Ordered in UC Medications - No data to display  Initial Impression / Assessment and Plan / UC Course  I have reviewed the triage vital signs and the nursing notes.  Pertinent labs & imaging results that were available during my care of the patient were reviewed by me and considered in my medical decision making (see chart for details).    Symptoms and presentation consistent with that of sciatic back pain.  Treating with short burst of prednisone x5 days.  Patient unable to tolerate muscle relaxers therefore did not prescribe.  Encourage patient to take acetaminophen extra strength for breakthrough pain.  Urine analysis is unremarkable.  If symptoms do not improve with current course of treatment follow-up with PCP for further work-up and evaluation.   Final Clinical Impressions(s) / UC Diagnoses   Final diagnoses:  Right sided sciatica     Discharge Instructions     Start prednisone 20 mg once daily with breakfast for 5 days.   For intermittent pain while taking prednisone take Tylenol 1000 mg 3 times daily as needed for breakthrough pain. Apply heat to the right lower back.  Avoid sitting for prolonged periods of time. Change positions every hour.    ED Prescriptions    Medication Sig Dispense Auth. Provider   predniSONE (DELTASONE) 20 MG tablet Take 1 tablet (20 mg total) by mouth daily with breakfast for 5 days. 5 tablet Scot Jun, FNP     PDMP not reviewed this encounter.   Scot Jun, FNP 06/29/20 [phone]

## 2020-06-29 NOTE — Discharge Instructions (Signed)
Start prednisone 20 mg once daily with breakfast for 5 days.   For intermittent pain while taking prednisone take Tylenol 1000 mg 3 times daily as needed for breakthrough pain. Apply heat to the right lower back.  Avoid sitting for prolonged periods of time. Change positions every hour.

## 2020-06-29 NOTE — ED Triage Notes (Signed)
Pt presents with localized and constant lower back pain X 1 week.

## 2020-06-30 ENCOUNTER — Ambulatory Visit: Payer: 59 | Admitting: Family

## 2020-07-14 ENCOUNTER — Ambulatory Visit (INDEPENDENT_AMBULATORY_CARE_PROVIDER_SITE_OTHER): Payer: 59 | Admitting: Family

## 2020-07-14 ENCOUNTER — Other Ambulatory Visit: Payer: Self-pay

## 2020-07-14 VITALS — BP 132/82 | HR 81 | Temp 98.7°F | Ht 67.0 in | Wt 182.8 lb

## 2020-07-14 DIAGNOSIS — R109 Unspecified abdominal pain: Secondary | ICD-10-CM

## 2020-07-14 DIAGNOSIS — I10 Essential (primary) hypertension: Secondary | ICD-10-CM | POA: Diagnosis not present

## 2020-07-14 MED ORDER — AMLODIPINE BESYLATE 5 MG PO TABS: 5.0000 mg | ORAL_TABLET | Freq: Every day | ORAL | 1 refills | Status: DC

## 2020-07-14 NOTE — Progress Notes (Signed)
Sonya Aguilar is a 50 y.o. female with the following history as recorded in EpicCare:  Patient Active Problem List   Diagnosis Date Noted   Bilateral knee pain 10/04/2011    Current Outpatient Medications  Medication Sig Dispense Refill   Cholecalciferol (VITAMIN D) 50 MCG (2000 UT) tablet      folic acid (FOLVITE) 474 MCG tablet      NON FORMULARY Take by mouth. Hair Skin and Nails     amLODipine (NORVASC) 5 MG tablet Take 1 tablet (5 mg total) by mouth daily. 90 tablet 1   No current facility-administered medications for this visit.    Allergies: Oxycodone  Past Medical History:  Diagnosis Date   Heart murmur    years ago   Hypertension     Past Surgical History:  Procedure Laterality Date   CESAREAN SECTION     CESAREAN SECTION     2000   miscarriage     2012    Family History  Problem Relation Age of Onset   Hypertension Mother    Hypertension Father    Diabetes Father    Cancer Father        prostate    Social History   Tobacco Use   Smoking status: Never   Smokeless tobacco: Never  Substance Use Topics   Alcohol use: No    Alcohol/week: 0.0 standard drinks    Subjective:   Follow up on blood pressure- dosage of Amlodipine was increased at last OV;   Also concerned about right flank pain that started earlier this month; notes pain was localized on her right side and did not radiate down into her leg; went to [facility] and was treated with prednisone with some benefit; she notes however the symptoms are persisting and she is having some urinary symptoms- just very concerned that something is wrong with her kidney and was misdiagnosed; asking about imaging options for kidneys.   Objective:  Vitals:   07/14/20 1546 07/14/20 1608  BP: 140/82 132/82  Pulse: 81   Temp: 98.7 F (37.1 C)   TempSrc: Oral   SpO2: 95%   Weight: 182 lb 12.8 oz (82.9 kg)   Height: 5\' 7"  (1.702 m)     General: Well developed, well nourished, in no acute distress  Skin : Warm  and dry.  Head: Normocephalic and atraumatic  Eyes: Sclera and conjunctiva clear; pupils round and reactive to light; extraocular movements intact  Ears: External normal; canals clear; tympanic membranes normal  Oropharynx: Pink, supple. No suspicious lesions  Neck: Supple without thyromegaly, adenopathy  Lungs: Respirations unlabored;  Musculoskeletal: No deformities; no active joint inflammation  Extremities: No edema, cyanosis, clubbing  Vessels: Symmetric bilaterally  Neurologic: Alert and oriented; speech intact; face symmetrical; moves all extremities well; CNII-XII intact without focal deficit   Assessment:  1. Abdominal pain, unspecified abdominal location   2. Essential hypertension     Plan:  Due to location of flank pain and recurrent urinary symptoms, will check renal stone CT; follow up to be determined; Improved- continue Amlodipine 5 mg daily; refill updated;   This visit occurred during the SARS-CoV-2 public health emergency.  Safety protocols were in place, including screening questions prior to the visit, additional usage of staff PPE, and extensive cleaning of exam room while observing appropriate contact time as indicated for disinfecting solutions.    No follow-ups on file.  Orders Placed This Encounter  Procedures   CT RENAL STONE STUDY    Standing  Status:   Future    Standing Expiration Date:   07/14/2021    Order Specific Question:   Is patient pregnant?    Answer:   No    Order Specific Question:   Preferred imaging location?    Answer:   [id number]     Requested Prescriptions   Signed Prescriptions Disp Refills   amLODipine (NORVASC) 5 MG tablet 90 tablet 1    Sig: Take 1 tablet (5 mg total) by mouth daily.

## 2020-07-21 ENCOUNTER — Telehealth: Payer: Self-pay

## 2020-07-21 NOTE — Telephone Encounter (Signed)
Pt called and says the pharmacy advised her to call her PCP about the Amlodipine that was sent in. She will call as well to see if she can figure anything out.

## 2020-07-22 NOTE — Telephone Encounter (Signed)
I have called the pt to gather so more information. There was no answer so I left a message to call back.

## 2020-07-23 NOTE — Telephone Encounter (Signed)
Left message on machine to call back  

## 2020-07-24 ENCOUNTER — Telehealth: Payer: Self-pay | Admitting: Family

## 2020-07-24 NOTE — Telephone Encounter (Signed)
FYI to provider

## 2020-07-24 NOTE — Telephone Encounter (Signed)
Once again I have attempted to contact pt. No answer and left a message to call back.,

## 2020-07-24 NOTE — Telephone Encounter (Signed)
Patient wants to inform that her amLODipine (NORVASC) 5 MG tablet   Script did work

## 2020-08-11 ENCOUNTER — Ambulatory Visit
Admission: RE | Admit: 2020-08-11 | Discharge: 2020-08-11 | Disposition: A | Discharge status: Home / Self Care | Payer: 59 | Source: Ambulatory Visit | Attending: Family | Admitting: Family

## 2020-08-11 DIAGNOSIS — R109 Unspecified abdominal pain: Secondary | ICD-10-CM

## 2020-10-29 ENCOUNTER — Ambulatory Visit: Payer: 59 | Admitting: Sports Medicine

## 2020-11-05 ENCOUNTER — Ambulatory Visit: Payer: 59 | Admitting: Sports Medicine

## 2020-11-19 ENCOUNTER — Other Ambulatory Visit: Payer: Self-pay

## 2020-11-19 ENCOUNTER — Ambulatory Visit (INDEPENDENT_AMBULATORY_CARE_PROVIDER_SITE_OTHER): Payer: 59 | Admitting: Sports Medicine

## 2020-11-19 ENCOUNTER — Encounter: Payer: Self-pay | Admitting: Sports Medicine

## 2020-11-19 DIAGNOSIS — K219 Gastro-esophageal reflux disease without esophagitis: Secondary | ICD-10-CM | POA: Insufficient documentation

## 2020-11-19 DIAGNOSIS — K5904 Chronic idiopathic constipation: Secondary | ICD-10-CM | POA: Insufficient documentation

## 2020-11-19 DIAGNOSIS — M79674 Pain in right toe(s): Secondary | ICD-10-CM

## 2020-11-19 DIAGNOSIS — M792 Neuralgia and neuritis, unspecified: Secondary | ICD-10-CM | POA: Diagnosis not present

## 2020-11-19 DIAGNOSIS — M79675 Pain in left toe(s): Secondary | ICD-10-CM

## 2020-11-19 DIAGNOSIS — Z860100 Personal history of colon polyps, unspecified: Secondary | ICD-10-CM | POA: Insufficient documentation

## 2020-11-19 DIAGNOSIS — Z1211 Encounter for screening for malignant neoplasm of colon: Secondary | ICD-10-CM | POA: Insufficient documentation

## 2020-11-19 DIAGNOSIS — B351 Tinea unguium: Secondary | ICD-10-CM

## 2020-11-19 DIAGNOSIS — R1033 Periumbilical pain: Secondary | ICD-10-CM | POA: Insufficient documentation

## 2020-11-19 DIAGNOSIS — R140 Abdominal distension (gaseous): Secondary | ICD-10-CM | POA: Insufficient documentation

## 2020-11-19 NOTE — Patient Instructions (Signed)
Nervive nerve relief supplement for your nerves can be purchased OTC at [facility]  

## 2020-11-19 NOTE — Progress Notes (Signed)
Subjective: Sonya Aguilar is a 50 y.o. female patient seen today in office with complaint of mildly painful thickened and discolored nails bilateral first toes and fourth and fifth toes. Patient is desiring treatment for nail changes; has tried OTC topicals/Medication in the past with no improvement. Reports that nails are becoming difficult to manage because of the thickness. Patient also admits that she has some awkward sensation sometimes in the ball of the feet would describe it as a discomfort occasionally but nothing constant states that she has had it for many years started after she had a dental procedure but is not sure if it is related.  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  Patient has no other pedal complaints at this time.   Patient Active Problem List   Diagnosis Date Noted   Chronic idiopathic constipation 11/19/2020   Colon cancer screening 11/19/2020   Abdominal bloating 11/19/2020   Gastroesophageal reflux disease 11/19/2020   Morbid obesity ([place]) 71/06/2692   Periumbilical pain 85/46/2703   Personal history of colonic polyps 11/19/2020   Hot flashes 03/19/2019   Hypertension 12/19/2018   Adenomyosis of uterus 09/11/2018   Bilateral knee pain 10/04/2011    Current Outpatient Medications on File Prior to Visit  Medication Sig Dispense Refill   amLODipine (NORVASC) 5 MG tablet Take 1 tablet (5 mg total) by mouth daily. 90 tablet 1   Cholecalciferol (VITAMIN D) 50 MCG (2000 UT) tablet      folic acid (FOLVITE) 500 MCG tablet      NON FORMULARY Take by mouth. Hair Skin and Nails     No current facility-administered medications on file prior to visit.    Allergies  Allergen Reactions   Oxycodone Nausea And Vomiting    Other reaction(s): upset stomach     Objective: Physical Exam  General: Well developed, nourished, no acute distress, awake, alert and oriented x 3  Vascular: Dorsalis  pedis artery 2/4 bilateral, Posterior tibial artery 2/4 bilateral, skin temperature warm to warm proximal to distal bilateral lower extremities, no varicosities, pedal hair present bilateral.  Neurological: Gross sensation present via light touch bilateral.  Protective and vibratory sensation intact bilateral.  Mulder sign negative bilateral.  Dermatological: Skin is warm, dry, and supple bilateral, bilateral hallux and fourth and fifth toe nails are tender, short thick, and discolored with mild subungal debris, no webspace macerations present bilateral, no open lesions present bilateral, no callus/corns/hyperkeratotic tissue present bilateral. No signs of infection bilateral.  Musculoskeletal: No boney deformities noted bilateral. Muscular strength within normal limits without painon range of motion. No pain with calf compression bilateral.  Assessment and Plan:  Problem List Items Addressed This Visit   None Visit Diagnoses     Nail fungus    -  Primary   Relevant Orders   Culture, fungus without smear   Pain in toes of both feet       Neuritis           -Examined patient -Discussed treatment options for painful dystrophic nails and neuritis -Advised patient to try over-the-counter Nervive nerve relief supplement and advised her that her pain could be mechanical from the way she walks and stands and be coming from her knees or other orthopedic problems -Fungal culture was obtained by removing a portion of the hard nail itself from each of the involved toenails using a sterile nail nipper and sent to [facility] lab. Patient tolerated the  biopsy procedure well without discomfort or need for anesthesia.  -Patient to return in 4 weeks for follow up evaluation and discussion of fungal culture results or sooner if symptoms worsen.  Landis Martins, DPM

## 2020-11-24 ENCOUNTER — Ambulatory Visit: Payer: 59 | Admitting: Family Medicine

## 2020-11-25 ENCOUNTER — Ambulatory Visit: Payer: 59 | Admitting: Family Medicine

## 2020-11-30 ENCOUNTER — Ambulatory Visit: Payer: 59 | Admitting: Family Medicine

## 2020-12-22 ENCOUNTER — Encounter: Payer: Self-pay | Admitting: Family

## 2020-12-22 ENCOUNTER — Ambulatory Visit (INDEPENDENT_AMBULATORY_CARE_PROVIDER_SITE_OTHER): Payer: 59 | Admitting: Family

## 2020-12-22 ENCOUNTER — Other Ambulatory Visit: Payer: Self-pay

## 2020-12-22 ENCOUNTER — Ambulatory Visit (HOSPITAL_BASED_OUTPATIENT_CLINIC_OR_DEPARTMENT_OTHER)
Admission: RE | Admit: 2020-12-22 | Discharge: 2020-12-22 | Disposition: A | Discharge status: Home / Self Care | Payer: 59 | Source: Ambulatory Visit | Attending: Family | Admitting: Family

## 2020-12-22 VITALS — BP 146/96 | HR 68 | Temp 98.1°F | Ht 67.0 in | Wt 183.0 lb

## 2020-12-22 DIAGNOSIS — R7303 Prediabetes: Secondary | ICD-10-CM | POA: Diagnosis not present

## 2020-12-22 DIAGNOSIS — M545 Low back pain, unspecified: Secondary | ICD-10-CM | POA: Diagnosis not present

## 2020-12-22 DIAGNOSIS — G8929 Other chronic pain: Secondary | ICD-10-CM | POA: Insufficient documentation

## 2020-12-22 DIAGNOSIS — R200 Anesthesia of skin: Secondary | ICD-10-CM | POA: Diagnosis not present

## 2020-12-22 DIAGNOSIS — R202 Paresthesia of skin: Secondary | ICD-10-CM

## 2020-12-22 NOTE — Progress Notes (Signed)
Sonya Aguilar is a 50 y.o. female with the following history as recorded in EpicCare:  Patient Active Problem List   Diagnosis Date Noted   Chronic idiopathic constipation 11/19/2020   Colon cancer screening 11/19/2020   Abdominal bloating 11/19/2020   Gastroesophageal reflux disease 11/19/2020   Morbid obesity ([place]) 46/27/0350   Periumbilical pain 09/38/1829   Personal history of colonic polyps 11/19/2020   Hot flashes 03/19/2019   Hypertension 12/19/2018   Adenomyosis of uterus 09/11/2018   Bilateral knee pain 10/04/2011    Current Outpatient Medications  Medication Sig Dispense Refill   amLODipine (NORVASC) 5 MG tablet Take 1 tablet (5 mg total) by mouth daily. 90 tablet 1   Cholecalciferol (VITAMIN D) 50 MCG (2000 UT) tablet      folic acid (FOLVITE) 937 MCG tablet      NON FORMULARY Take by mouth. Hair Skin and Nails     TURMERIC CURCUMIN PO Take by mouth.     zinc gluconate 50 MG tablet Take 50 mg by mouth daily.     No current facility-administered medications for this visit.    Allergies: Oxycodone  Past Medical History:  Diagnosis Date   Heart murmur    years ago   Hypertension     Past Surgical History:  Procedure Laterality Date   CESAREAN SECTION     CESAREAN SECTION     2000   miscarriage     2012    Family History  Problem Relation Age of Onset   Hypertension Mother    Hypertension Father    Diabetes Father    Cancer Father        prostate    Social History   Tobacco Use   Smoking status: Never   Smokeless tobacco: Never  Substance Use Topics   Alcohol use: No    Alcohol/week: 0.0 standard drinks    Subjective:  Patient presents with concerns for left arm wrist/ hand numbness/ tingling x 1 months; Also complaining of same sensation in lower feet- does have history of scoliosis;   Of note, she is not on her blood pressure medication today;      Objective:  Vitals:   12/22/20 1606  BP: (!) 146/96  Pulse: 68  Temp: 98.1 F (36.7  C)  TempSrc: Oral  SpO2: 96%  Weight: 183 lb (83 kg)  Height: _0  (1.702 m)    General: Well developed, well nourished, in no acute distress  Skin : Warm and dry.  Head: Normocephalic and atraumatic  Lungs: Respirations unlabored;  Musculoskeletal: No deformities; no active joint inflammation  Extremities: No edema, cyanosis, clubbing  Vessels: Symmetric bilaterally  Neurologic: Alert and oriented; speech intact; face symmetrical; moves all extremities well; CNII-XII intact without focal deficit   Assessment:  1. Numbness and tingling   2. Pre-diabetes   3. Chronic bilateral low back pain, unspecified whether sciatica present     Plan:  Suspect carpal tunnel in left upper extremity; will update labs; will most likely need ortho consult; Check Hgba1c; Update lumbar X-ray; suspect will need lumbar MRI and/or neuro evaluation.   This visit occurred during the SARS-CoV-2 public health emergency.  Safety protocols were in place, including screening questions prior to the visit, additional usage of staff PPE, and extensive cleaning of exam room while observing appropriate contact time as indicated for disinfecting solutions.    No follow-ups on file.  Orders Placed This Encounter  Procedures   DG Lumbar Spine Complete  Standing Status:   Future    Number of Occurrences:   1    Standing Expiration Date:   12/22/2021    Order Specific Question:   Reason for Exam (SYMPTOM  OR DIAGNOSIS REQUIRED)    Answer:   low back pain/ numbness and tingling in extremities    Order Specific Question:   Is patient pregnant?    Answer:   No    Order Specific Question:   Preferred imaging location?    Answer:   [facility]   Comp Met (CMET)   Hemoglobin A1c   Magnesium   B12 and Folate Panel   CBC with Differential/Platelet    Requested Prescriptions    No prescriptions requested or ordered in this encounter

## 2020-12-23 LAB — CBC WITH DIFFERENTIAL/PLATELET
Basophils Absolute: 0 10*3/uL (ref 0.0–0.1)
Basophils Relative: 0.4 % (ref 0.0–3.0)
Eosinophils Absolute: 0.1 10*3/uL (ref 0.0–0.7)
Eosinophils Relative: 1.4 % (ref 0.0–5.0)
HCT: 37.9 % (ref 36.0–46.0)
Hemoglobin: 12.1 g/dL (ref 12.0–15.0)
Lymphocytes Relative: 38.8 % (ref 12.0–46.0)
Lymphs Abs: 2.3 10*3/uL (ref 0.7–4.0)
MCHC: 32 g/dL (ref 30.0–36.0)
MCV: 81 fl (ref 78.0–100.0)
Monocytes Absolute: 0.3 10*3/uL (ref 0.1–1.0)
Monocytes Relative: 5.7 % (ref 3.0–12.0)
Neutro Abs: 3.2 10*3/uL (ref 1.4–7.7)
Neutrophils Relative %: 53.7 % (ref 43.0–77.0)
Platelets: 308 10*3/uL (ref 150.0–400.0)
RBC: 4.67 Mil/uL (ref 3.87–5.11)
RDW: 15.8 % — ABNORMAL HIGH (ref 11.5–15.5)
WBC: 6 10*3/uL (ref 4.0–10.5)

## 2020-12-23 LAB — COMPREHENSIVE METABOLIC PANEL
ALT: 10 U/L (ref 0–35)
AST: 9 U/L (ref 0–37)
Albumin: 4.4 g/dL (ref 3.5–5.2)
Alkaline Phosphatase: 84 U/L (ref 39–117)
BUN: 10 mg/dL (ref 6–23)
CO2: 27 mEq/L (ref 19–32)
Calcium: 9.5 mg/dL (ref 8.4–10.5)
Chloride: 103 mEq/L (ref 96–112)
Creatinine, Ser: 0.77 mg/dL (ref 0.40–1.20)
GFR: 89.13 mL/min (ref >60.00)
Glucose, Bld: 91 mg/dL (ref 70–99)
Potassium: 3.7 mEq/L (ref 3.5–5.1)
Sodium: 140 mEq/L (ref 135–145)
Total Bilirubin: 0.3 mg/dL (ref 0.2–1.2)
Total Protein: 8 g/dL (ref 6.0–8.3)

## 2020-12-23 LAB — B12 AND FOLATE PANEL
Folate: 20.9 ng/mL (ref >5.9)
Vitamin B-12: 1306 pg/mL — ABNORMAL HIGH (ref 211–911)

## 2020-12-23 LAB — HEMOGLOBIN A1C: Hgb A1c MFr Bld: 6.1 % (ref 4.6–6.5)

## 2020-12-23 LAB — MAGNESIUM: Magnesium: 2.2 mg/dL (ref 1.5–2.5)

## 2020-12-24 ENCOUNTER — Ambulatory Visit: Payer: 59 | Admitting: Sports Medicine

## 2020-12-24 ENCOUNTER — Telehealth: Payer: Self-pay | Admitting: Sports Medicine

## 2020-12-24 NOTE — Telephone Encounter (Signed)
Patient would like Dr. Cannon Kettle to call and go over fungal results.

## 2020-12-25 ENCOUNTER — Other Ambulatory Visit: Payer: Self-pay | Admitting: Family

## 2020-12-25 ENCOUNTER — Telehealth (INDEPENDENT_AMBULATORY_CARE_PROVIDER_SITE_OTHER): Payer: 59 | Admitting: Sports Medicine

## 2020-12-25 DIAGNOSIS — M79674 Pain in right toe(s): Secondary | ICD-10-CM

## 2020-12-25 DIAGNOSIS — M79675 Pain in left toe(s): Secondary | ICD-10-CM

## 2020-12-25 DIAGNOSIS — G5602 Carpal tunnel syndrome, left upper limb: Secondary | ICD-10-CM

## 2020-12-25 DIAGNOSIS — R200 Anesthesia of skin: Secondary | ICD-10-CM

## 2020-12-25 DIAGNOSIS — B351 Tinea unguium: Secondary | ICD-10-CM

## 2020-12-25 NOTE — Telephone Encounter (Signed)
Virtual Visit via Telephone Note  I connected with Sonya Aguilar on 12/25/2020 at 3:40 PM by telephone and verified that I am speaking with the correct person using two identifiers.  Location: Patient: Sonya Aguilar, home Provider: Landis Martins, DPM,  [facility] [place] office   I discussed the limitations, risks, security and privacy concerns of performing an evaluation and management service by telephone and the availability of in person appointments. I also discussed with the patient that there may be a patient responsible charge related to this service. The patient expressed understanding and agreed to proceed.   History of Present Illness: Patient met via telephone visit to discuss fungal culture results.  Patient reports that things are about the same did recently go for a pedicure but denies any other new problems.  Patient also admits that she saw her primary doctor about the tingling and is getting further work-up on her back and also had an A1c that was normal.  Patient denies any other concerns at this time.   Observations/Objective: Physical exam unable to be performed due to telephone nature of visit  Assessment and Plan: Problem List Items Addressed This Visit   None Visit Diagnoses     Nail fungus    -  Primary   Pain in toes of both feet          -Discussed with patient fungal culture results which are negative suggestive of change to nail secondary to possible chemical/dermatitis to nail products versus microtrauma from shoes versus occult treated fungus -Advised patient to give herself a break from nail polish in case this is a dermatitis -Advised patient to take a break from high heels or to wear properly fitting shoes advised patient to prevent microtrauma or rubbing of toes -Advised patient to pick up from our office Tolcelyn nail gel to use to her toenails daily as directed  Follow Up Instructions: -As needed or if symptoms fail to  continue to improve   I discussed the assessment and treatment plan with the patient. The patient was provided an opportunity to ask questions and all were answered. The patient agreed with the plan and demonstrated an understanding of the instructions.   The patient was advised to call back or seek an in-person evaluation if the symptoms worsen or if the condition fails to improve as anticipated.  I provided 11 minutes of non-face-to-face time during this encounter.   Landis Martins, DPM

## 2021-01-05 ENCOUNTER — Ambulatory Visit: Payer: 59 | Admitting: Orthopedic Surgery

## 2021-03-01 ENCOUNTER — Ambulatory Visit: Payer: 59 | Admitting: Neurology

## 2021-03-01 ENCOUNTER — Encounter: Payer: Self-pay | Admitting: Neurology

## 2021-03-01 VITALS — BP 108/71 | HR 72 | Ht 67.0 in | Wt 187.0 lb

## 2021-03-01 DIAGNOSIS — R202 Paresthesia of skin: Secondary | ICD-10-CM

## 2021-03-01 MED ORDER — GABAPENTIN 100 MG PO CAPS: 300.0000 mg | ORAL_CAPSULE | Freq: Every evening | ORAL | 3 refills | Status: DC | PRN

## 2021-03-01 NOTE — Progress Notes (Signed)
Chief Complaint  Patient presents with   New Patient (Initial Visit)    Rm 14. Alone. PCP is Jodi Mourning, FNP. NP Internal referral for extremity numbness. Pt reports numbness and tingling in both feet. States she had dental work done last year and afterwards she began to experience numbness and tingling in both hands. Numbness in hands still occurs occasionally. Was on a high dose of B12, questioned if that would cause numbness and tingling.      ASSESSMENT AND PLAN  Sonya Aguilar is a 51 y.o. female   Bilateral lower extremity and hands paresthesia,  Length dependent sensory changes, absent ankle reflex,  Initiation diagnosis include peripheral neuropathy, possible upper extremity focal neuropathy  EMG nerve conduction study  Gabapentin as needed   DIAGNOSTIC DATA (LABS, IMAGING, TESTING) - I reviewed patient records, labs, notes, testing and imaging myself where available.  Laboratory evaluation in November 2022, elevated E93 8101, normal folic acid 75.1, normal magnesium 2.2, A1c 6.1, normal CMP creatinine of 0.77, CBC hemoglobin of 12.1,  MEDICAL HISTORY:  Sonya Aguilar is a 51 year old female, seen in request by her primary care nurse practitioner   Marrian Salvage, for evaluation of bilateral upper and lower extremity paresthesia, initial evaluation was on March 01, 2021  I reviewed and summarized the referring note. PMHX. HTN  She worked as a Theme park manager for many years, noticed mild low back pain knee pain, but denied difficulty using her hands, denies gait abnormality, she also has low back pain, denies radiating pain, denies bowel and bladder incontinence  Since 2022, she noticed intermittent numbness tingling at the plantar foot, toes, especially at nighttime when she is off her feet, burning tingly sensation lasting 30 to 60 minutes, sometimes preventing her from going to sleep, in addition, she has intermittent bilateral hands paresthesia, involving  both side, seems to involve all 5 fingers  Laboratory evaluation showed prediabetes A1c 6.1, she was taking B12 supplement, level was elevated, since she stopped   PHYSICAL EXAM:   Vitals:   03/01/21 1548  BP: 108/71  Pulse: 72  Weight: 187 lb (84.8 kg)  Height: 5\' 7"  (1.702 m)   Not recorded     Body mass index is 29.29 kg/m.  PHYSICAL EXAMNIATION:  Gen: NAD, conversant, well nourised, well groomed                     Cardiovascular: Regular rate rhythm, no peripheral edema, warm, nontender. Eyes: Conjunctivae clear without exudates or hemorrhage Neck: Supple, no carotid bruits. Pulmonary: Clear to auscultation bilaterally   NEUROLOGICAL EXAM:  MENTAL STATUS: Speech:    Speech is normal; fluent and spontaneous with normal comprehension.  Cognition:     Orientation to time, place and person     Normal recent and remote memory     Normal Attention span and concentration     Normal Language, naming, repeating,spontaneous speech     Fund of knowledge   CRANIAL NERVES: CN II: Visual fields are full to confrontation. Pupils are round equal and briskly reactive to light. CN III, IV, VI: extraocular movement are normal. No ptosis. CN V: Facial sensation is intact to light touch CN VII: Face is symmetric with normal eye closure  CN VIII: Hearing is normal to causal conversation. CN IX, X: Phonation is normal. CN XI: Head turning and shoulder shrug are intact  MOTOR: There is no pronator drift of out-stretched arms. Muscle bulk and tone are normal. Muscle strength is  normal.  REFLEXES: Reflexes are 2+ and symmetric at the biceps, triceps, knees, and absent at ankles. Plantar responses are flexor.  SENSORY: Mildly length dependent decreased light touch pinprick to above ankle level, preserved to toe vibratory sensation  COORDINATION: There is no trunk or limb dysmetria noted.  GAIT/STANCE: Posture is normal. Gait is steady with normal steps, base, arm swing, and  turning. Heel and toe walking are normal. Tandem gait is normal.  Romberg is absent.  REVIEW OF SYSTEMS:  Full 14 system review of systems performed and notable only for as above All other review of systems were negative.   ALLERGIES: Allergies  Allergen Reactions   Eggs Or Egg-Derived Products Nausea Only   Oxycodone Nausea And Vomiting    Other reaction(s): upset stomach     HOME MEDICATIONS: Current Outpatient Medications  Medication Sig Dispense Refill   amLODipine (NORVASC) 5 MG tablet Take 1 tablet (5 mg total) by mouth daily. 90 tablet 1   Cholecalciferol (VITAMIN D) 50 MCG (2000 UT) tablet      NON FORMULARY Take by mouth. Hair Skin and Nails     TURMERIC CURCUMIN PO Take by mouth.     zinc gluconate 50 MG tablet Take 50 mg by mouth daily.     No current facility-administered medications for this visit.    PAST MEDICAL HISTORY: Past Medical History:  Diagnosis Date   Heart murmur    years ago   Hypertension    Scoliosis     PAST SURGICAL HISTORY: Past Surgical History:  Procedure Laterality Date   CESAREAN SECTION     CESAREAN SECTION     2000   miscarriage     2012    FAMILY HISTORY: Family History  Problem Relation Age of Onset   Hypertension Mother    Hypertension Father    Diabetes Father    Cancer Father        prostate    SOCIAL HISTORY: Social History   Socioeconomic History   Marital status: Legally Separated    Spouse name: Not on file   Number of children: Not on file   Years of education: Not on file   Highest education level: Not on file  Occupational History   Not on file  Tobacco Use   Smoking status: Never   Smokeless tobacco: Never  Vaping Use   Vaping Use: Never used  Substance and Sexual Activity   Alcohol use: No    Alcohol/week: 0.0 standard drinks   Drug use: No   Sexual activity: Not on file  Other Topics Concern   Not on file  Social History Narrative   Not on file   Social Determinants of Health    Financial Resource Strain: Not on file  Food Insecurity: Not on file  Transportation Needs: Not on file  Physical Activity: Not on file  Stress: Not on file  Social Connections: Not on file  Intimate Partner Violence: Not on file      Marcial Pacas, M.D. Ph.D.  [facility] [address], [place], [place] [address] Ph: [phone] Fax: [phone]  CC:  Marrian Salvage, [place] [address] [place],  [place] [id number]  Marrian Salvage, [place]

## 2021-03-30 ENCOUNTER — Encounter: Payer: Self-pay | Admitting: Family

## 2021-03-30 ENCOUNTER — Ambulatory Visit (INDEPENDENT_AMBULATORY_CARE_PROVIDER_SITE_OTHER): Payer: 59 | Admitting: Family

## 2021-03-30 ENCOUNTER — Ambulatory Visit: Payer: Self-pay | Admitting: Family

## 2021-03-30 VITALS — BP 130/82 | HR 74 | Temp 98.0°F | Ht 67.0 in | Wt 184.0 lb

## 2021-03-30 DIAGNOSIS — R7303 Prediabetes: Secondary | ICD-10-CM

## 2021-03-30 DIAGNOSIS — H547 Unspecified visual loss: Secondary | ICD-10-CM

## 2021-03-30 DIAGNOSIS — Z1231 Encounter for screening mammogram for malignant neoplasm of breast: Secondary | ICD-10-CM | POA: Diagnosis not present

## 2021-03-30 NOTE — Progress Notes (Signed)
?Sonya Aguilar is a 51 y.o. female with the following history as recorded in Epic[facility]:  ?Patient Active Problem List  ? Diagnosis Date Noted  ? Paresthesia 03/01/2021  ? Chronic idiopathic constipation 11/19/2020  ? Colon cancer screening 11/19/2020  ? Abdominal bloating 11/19/2020  ? Gastroesophageal reflux disease 11/19/2020  ? Morbid obesity ([place]) 11/19/2020  ? Periumbilical pain 16/07/3708  ? Personal history of colonic polyps 11/19/2020  ? Hot flashes 03/19/2019  ? Hypertension 12/19/2018  ? Adenomyosis of uterus 09/11/2018  ? Bilateral knee pain 10/04/2011  ?  ?Current Outpatient Medications  ?Medication Sig Dispense Refill  ? amLODipine (NORVASC) 5 MG tablet Take 1 tablet (5 mg total) by mouth daily. 90 tablet 1  ? Cholecalciferol (VITAMIN D) 50 MCG (2000 UT) tablet     ? NON FORMULARY Take by mouth. Hair Skin and Nails    ? TURMERIC CURCUMIN PO Take by mouth.    ? zinc gluconate 50 MG tablet Take 50 mg by mouth daily.    ? gabapentin (NEURONTIN) 100 MG capsule Take 3 capsules (300 mg total) by mouth at bedtime as needed. (Patient not taking: Reported on 03/30/2021) 90 capsule 3  ? ?No current facility-administered medications for this visit.  ?  ?Allergies: Eggs or egg-derived products and Oxycodone  ?Past Medical History:  ?Diagnosis Date  ? Heart murmur   ? years ago  ? Hypertension   ? Scoliosis   ?  ?Past Surgical History:  ?Procedure Laterality Date  ? CESAREAN SECTION    ? CESAREAN SECTION    ? 2000  ? miscarriage    ? 2012  ?  ?Family History  ?Problem Relation Age of Onset  ? Hypertension Mother   ? Hypertension Father   ? Diabetes Father   ? Cancer Father   ?     prostate  ?  ?Social History  ? ?Tobacco Use  ? Smoking status: Never  ? Smokeless tobacco: Never  ?Substance Use Topics  ? Alcohol use: No  ?  Alcohol/week: 0.0 standard drinks  ?  ?Subjective:  ? ?Requesting referral to ophthalmology- feels like her vision has been off recently; notes that symptoms are worse mostly at night and feels  like there is a "film" over his eyes;  ? ?Also requesting to have Hgba1c re-checked; known history of pre-diabetes;  ? ?Due for mammogram as well- in the process of establishing with new GYN and needs updated order;  ? ? ? ?Objective:  ?Vitals:  ? 03/30/21 1430  ?BP: 130/82  ?Pulse: 74  ?Temp: 98 ?F (36.7 ?C)  ?TempSrc: Oral  ?SpO2: 98%  ?Weight: 184 lb (83.5 kg)  ?Height: '5\' 7"'  (1.702 m)  ?  ?General: Well developed, well nourished, in no acute distress  ?Skin : Warm and dry.  ?Head: Normocephalic and atraumatic  ?Eyes: Sclera and conjunctiva clear; pupils round and reactive to light; extraocular movements intact  ?Ears: External normal; canals clear; tympanic membranes normal  ?Oropharynx: Pink, supple. No suspicious lesions  ?Neck: Supple without thyromegaly, adenopathy  ?Lungs: Respirations unlabored;  ?Neurologic: Alert and oriented; speech intact; face symmetrical; moves all extremities well; CNII-XII intact without focal deficit  ? ?Assessment:  ?1. Pre-diabetes   ?2. Decreased vision   ?3. Visit for screening mammogram   ?  ?Plan:  ?Order updated; ?Referral to ophthalmology; also discussed need to try and keep her eyes moisturized- can use OTC drops like Refresh for now; ?Order updated; call back if she needs GYN referral;  ? ?  This visit occurred during the SARS-CoV-2 public health emergency.  Safety protocols were in place, including screening questions prior to the visit, additional usage of staff PPE, and extensive cleaning of exam room while observing appropriate contact time as indicated for disinfecting solutions.  ? ? ?No follow-ups on file.  ?Orders Placed This Encounter  ?Procedures  ? MM Digital Screening  ?  Standing Status:   Future  ?  Standing Expiration Date:   03/31/2022  ?  Order Specific Question:   Reason for Exam (SYMPTOM  OR DIAGNOSIS REQUIRED)  ?  Answer:   screening mammogram  ?  Order Specific Question:   Is the patient pregnant?  ?  Answer:   No  ?  Order Specific Question:   Preferred  imaging location?  ?  Answer:   [facility]  ? CBC with Differential/Platelet  ? Comp Met (CMET)  ? Hemoglobin A1c  ? Ambulatory referral to Ophthalmology  ?  Referral Priority:   Routine  ?  Referral Type:   Consultation  ?  Referral Reason:   Specialty Services Required  ?  Requested Specialty:   Ophthalmology  ?  Number of Visits Requested:   1  ?  ?Requested Prescriptions  ? ? No prescriptions requested or ordered in this encounter  ?  ? ?

## 2021-03-31 ENCOUNTER — Ambulatory Visit (INDEPENDENT_AMBULATORY_CARE_PROVIDER_SITE_OTHER): Payer: 59 | Admitting: Family Medicine

## 2021-03-31 ENCOUNTER — Telehealth: Payer: Self-pay | Admitting: *Deleted

## 2021-03-31 VITALS — BP 148/86 | Ht 67.0 in | Wt 184.0 lb

## 2021-03-31 DIAGNOSIS — M25562 Pain in left knee: Secondary | ICD-10-CM | POA: Diagnosis not present

## 2021-03-31 DIAGNOSIS — M25561 Pain in right knee: Secondary | ICD-10-CM

## 2021-03-31 DIAGNOSIS — G8929 Other chronic pain: Secondary | ICD-10-CM

## 2021-03-31 LAB — CBC WITH DIFFERENTIAL/PLATELET
Basophils Absolute: 0.1 10*3/uL (ref 0.0–0.1)
Basophils Relative: 1.2 % (ref 0.0–3.0)
Eosinophils Absolute: 0.1 10*3/uL (ref 0.0–0.7)
Eosinophils Relative: 1.6 % (ref 0.0–5.0)
HCT: 37.3 % (ref 36.0–46.0)
Hemoglobin: 12.1 g/dL (ref 12.0–15.0)
Lymphocytes Relative: 43.1 % (ref 12.0–46.0)
Lymphs Abs: 2.1 10*3/uL (ref 0.7–4.0)
MCHC: 32.5 g/dL (ref 30.0–36.0)
MCV: 80.5 fl (ref 78.0–100.0)
Monocytes Absolute: 0.3 10*3/uL (ref 0.1–1.0)
Monocytes Relative: 7 % (ref 3.0–12.0)
Neutro Abs: 2.3 10*3/uL (ref 1.4–7.7)
Neutrophils Relative %: 47.1 % (ref 43.0–77.0)
Platelets: 292 10*3/uL (ref 150.0–400.0)
RBC: 4.63 Mil/uL (ref 3.87–5.11)
RDW: 16 % — ABNORMAL HIGH (ref 11.5–15.5)
WBC: 4.9 10*3/uL (ref 4.0–10.5)

## 2021-03-31 LAB — COMPREHENSIVE METABOLIC PANEL
ALT: 8 U/L (ref 0–35)
AST: 10 U/L (ref 0–37)
Albumin: 4.4 g/dL (ref 3.5–5.2)
Alkaline Phosphatase: 103 U/L (ref 39–117)
BUN: 12 mg/dL (ref 6–23)
CO2: 27 mEq/L (ref 19–32)
Calcium: 9.4 mg/dL (ref 8.4–10.5)
Chloride: 104 mEq/L (ref 96–112)
Creatinine, Ser: 0.71 mg/dL (ref 0.40–1.20)
GFR: 98.06 mL/min (ref >60.00)
Glucose, Bld: 92 mg/dL (ref 70–99)
Potassium: 4.1 mEq/L (ref 3.5–5.1)
Sodium: 139 mEq/L (ref 135–145)
Total Bilirubin: 0.3 mg/dL (ref 0.2–1.2)
Total Protein: 7.7 g/dL (ref 6.0–8.3)

## 2021-03-31 LAB — HEMOGLOBIN A1C: Hgb A1c MFr Bld: 6.2 % (ref 4.6–6.5)

## 2021-03-31 MED ORDER — METHYLPREDNISOLONE ACETATE 40 MG/ML IJ SUSP
40.0000 mg | Freq: Once | INTRAMUSCULAR | Status: AC
  Administered 2021-03-31: 40 mg via INTRA_ARTICULAR

## 2021-03-31 NOTE — Telephone Encounter (Signed)
Patient notified of results.  She declined the medication at this time.  She has some appointments with specialist and would like to see if that will help.  She does not like to take medications that may do more harm and does not like to take medications anyway. ?

## 2021-03-31 NOTE — Telephone Encounter (Signed)
-----   Message from Marrian Salvage, [place] sent at 03/31/2021  1:41 PM EST ----- ?Labs are stable- still show pre-diabetes. We could try a low dose Metformin for her. It may be the odd numb/ tingling sensations she is experiencing is related to the blood sugar being higher than "normal." Please let me know her response.  ?

## 2021-03-31 NOTE — Patient Instructions (Signed)
Your knee pain is due to mild arthritis. ?These are the different medications you can take for this: ?Tylenol '500mg'$  1-2 tabs three times a day for pain. ?Capsaicin, aspercreme, or biofreeze topically up to four times a day may also help with pain. ?Some supplements that may help for arthritis: Boswellia extract, curcumin, pycnogenol ?Aleve 1-2 tabs twice a day with food ?Cortisone injections are an option - you were given these today. ?If cortisone injections do not help, there are different types of shots that may help but they take longer to take effect. ?It's important that you continue to stay active. ?Straight leg raises, knee extensions 3 sets of 10 once a day (add ankle weight if these become too easy). ?Consider physical therapy to strengthen muscles around the joint that hurts to take pressure off of the joint itself. ?Shoe inserts with good arch support may be helpful. ?Heat or ice 15 minutes at a time 3-4 times a day as needed to help with pain. ?Water aerobics and cycling with low resistance are the best two types of exercise for arthritis though any exercise is ok as long as it doesn't worsen the pain. ?Follow up with me in 1 month or as needed for this.  ? ?Keep follow up with neurology and I would go through with the testing they recommend. ?If they do a comprehensive workup and still are uncertain the cause of this tingling, come back and see me and we will investigate your low back but it's less likely the cause based on your history. ?

## 2021-04-01 ENCOUNTER — Encounter: Payer: Self-pay | Admitting: Family Medicine

## 2021-04-01 NOTE — Progress Notes (Signed)
PCP: Sonya Aguilar, [place] ? ?Subjective:  ? ?HPI: ?Patient is a 51 y.o. female here for bilateral knee pain. ? ?Patient reports she's had bilateral knee pain for years but slowly worsening recently. ?Responded well to steroid injections in the past and interested in repeating this. ?Swelling improved compared to prior visit. ?No new injuries or trauma. ? ?Past Medical History:  ?Diagnosis Date  ? Heart murmur   ? years ago  ? Hypertension   ? Scoliosis   ? ? ?Current Outpatient Medications on File Prior to Visit  ?Medication Sig Dispense Refill  ? amLODipine (NORVASC) 5 MG tablet Take 1 tablet (5 mg total) by mouth daily. 90 tablet 1  ? Cholecalciferol (VITAMIN D) 50 MCG (2000 UT) tablet     ? gabapentin (NEURONTIN) 100 MG capsule Take 3 capsules (300 mg total) by mouth at bedtime as needed. (Patient not taking: Reported on 03/30/2021) 90 capsule 3  ? NON FORMULARY Take by mouth. Hair Skin and Nails    ? TURMERIC CURCUMIN PO Take by mouth.    ? zinc gluconate 50 MG tablet Take 50 mg by mouth daily.    ? ?No current facility-administered medications on file prior to visit.  ? ? ?Past Surgical History:  ?Procedure Laterality Date  ? CESAREAN SECTION    ? CESAREAN SECTION    ? 2000  ? miscarriage    ? 2012  ? ? ?Allergies  ?Allergen Reactions  ? Eggs Or Egg-Derived Products Nausea Only  ? Oxycodone Nausea And Vomiting  ?  Other reaction(s): upset stomach   ? ? ?BP (!) 148/86   Ht '5\' 7"'$  (1.702 m)   Wt 184 lb (83.5 kg)   BMI 28.82 kg/m?  ? ?No flowsheet data found. ? ?No flowsheet data found. ? ?    ?Objective:  ?Physical Exam: ? ?Gen: NAD, comfortable in exam room ? ?Bilateral knees: ?No gross deformity, ecchymoses, swelling. ?TTP medial joint lines. ?FROM with normal strength. ?Negative ant/post drawers. Negative valgus/varus testing. Negative lachman. ?Negative mcmurrays, apleys. ?NV intact distally. ?  ?Assessment & Plan:  ?1. Bilateral knee pain - 2/2 flare of mild arthritis.  Discussed tylenol, topical  medications, supplements, nsaids.  Quad strengthening, exercise.  Steroid injections given today. ? ?After informed written consent timeout was performed, patient was seated on exam table. Right knee was prepped with alcohol swab and utilizing anteromedial approach, patient's right knee was injected intraarticularly with 3:1 lidocaine: depomedrol. Patient tolerated the procedure well without immediate complications. ? ?After informed written consent timeout was performed, patient was seated on exam table. Left knee was prepped with alcohol swab and utilizing anteromedial approach, patient's left knee was injected intraarticularly with 3:1 lidocaine: depomedrol. Patient tolerated the procedure well without immediate complications. ? ?We also discussed she's been evaluated and had some workup for bilateral foot and hand tingling, numbness. B12 was normal.  A1c prediabetic - would expect several years of type 2 diabetes before developing neuropathy so unlikely the cause.  She has been seen by neurology and they discussed NCV/EMGs but she was reluctant to do these because it involved needles - encouraged her to go ahead with this as next step - she stated she will contact their office. ?

## 2021-04-01 NOTE — Telephone Encounter (Signed)
Patient called and wanted a little more information about the medication that would be prescribed.... possible interested now ? ?Patient would like a call back from nurse  ?

## 2021-04-02 NOTE — Telephone Encounter (Signed)
I have called the pt and there was no answer so I left a message to call back.  ?

## 2021-04-02 NOTE — Telephone Encounter (Signed)
Called left message to call back 

## 2021-04-06 NOTE — Telephone Encounter (Signed)
I have spoken to the pt and she stated that she changed her mind again. She is going to wait on taking the medication for now.  ?

## 2021-04-20 ENCOUNTER — Encounter: Payer: Self-pay | Admitting: Neurology

## 2021-04-20 ENCOUNTER — Telehealth: Payer: Self-pay | Admitting: Neurology

## 2021-04-20 NOTE — Telephone Encounter (Signed)
FYI, Patient missed her appointment today for NCV/EMG. She called in a few minutes ago to check when her appointment is and said she had forgotten to write down the information. I let her know we may need to refer out at this point since we're losing our current technician. Patient understood and knows we'll be working on rescheduling or having it done another way.  ?

## 2021-04-23 ENCOUNTER — Ambulatory Visit (HOSPITAL_BASED_OUTPATIENT_CLINIC_OR_DEPARTMENT_OTHER)
Admission: RE | Admit: 2021-04-23 | Discharge: 2021-04-23 | Disposition: A | Discharge status: Home / Self Care | Payer: 59 | Source: Ambulatory Visit | Attending: Family | Admitting: Family

## 2021-04-23 ENCOUNTER — Encounter (HOSPITAL_BASED_OUTPATIENT_CLINIC_OR_DEPARTMENT_OTHER): Payer: Self-pay | Admitting: Radiology

## 2021-04-23 DIAGNOSIS — Z1231 Encounter for screening mammogram for malignant neoplasm of breast: Secondary | ICD-10-CM | POA: Diagnosis present

## 2021-05-12 ENCOUNTER — Encounter: Payer: Self-pay | Admitting: Neurology

## 2021-07-23 ENCOUNTER — Ambulatory Visit
Admission: EM | Admit: 2021-07-23 | Discharge: 2021-07-23 | Disposition: A | Discharge status: Home / Self Care | Payer: 59 | Attending: Internal Medicine | Admitting: Internal Medicine

## 2021-07-23 DIAGNOSIS — J069 Acute upper respiratory infection, unspecified: Secondary | ICD-10-CM

## 2021-07-23 NOTE — ED Provider Notes (Signed)
[facility]    CSN: [id number] Arrival date & time: 07/23/21  1519      History   Chief Complaint Chief Complaint  Patient presents with   nasal drainage    HPI Sonya Aguilar is a 51 y.o. female.   Patient presents with nasal drainage, nasal congestion, cough fever, chills.  Majority of symptoms started about 5 days ago.  Fever and chills started yesterday.  Patient has sore throat that has now resolved.  Tmax at home was 102.  Denies any known sick contacts.  Denies chest pain, shortness of breath, ear pain, nausea, vomiting, diarrhea, abdominal pain.  Patient has taken Coricidin HBP and Advil with minimal improvement.  Patient has mildly elevated blood pressure reading but reports that she has not taken her blood pressure medication today.  Denies headache, dizziness, blurred vision.     Past Medical History:  Diagnosis Date   Heart murmur    years ago   Hypertension    Scoliosis     Patient Active Problem List   Diagnosis Date Noted   Paresthesia 03/01/2021   Chronic idiopathic constipation 11/19/2020   Colon cancer screening 11/19/2020   Abdominal bloating 11/19/2020   Gastroesophageal reflux disease 11/19/2020   Morbid obesity ([place]) 87/56/4332   Periumbilical pain 95/18/8416   Personal history of colonic polyps 11/19/2020   Hot flashes 03/19/2019   Hypertension 12/19/2018   Adenomyosis of uterus 09/11/2018   Bilateral knee pain 10/04/2011    Past Surgical History:  Procedure Laterality Date   CESAREAN SECTION     CESAREAN SECTION     2000   miscarriage     2012    OB History   No obstetric history on file.      Home Medications    Prior to Admission medications   Medication Sig Start Date End Date Taking? Authorizing Provider  amLODipine (NORVASC) 5 MG tablet Take 1 tablet (5 mg total) by mouth daily. 07/14/20   Marrian Salvage, FNP  Cholecalciferol (VITAMIN D) 50 MCG (2000 UT) tablet     [provider]   gabapentin (NEURONTIN) 100 MG capsule Take 3 capsules (300 mg total) by mouth at bedtime as needed. Patient not taking: Reported on 03/30/2021 03/01/21   Marcial Pacas, MD  NON FORMULARY Take by mouth. Hair Skin and Nails    [provider]  TURMERIC CURCUMIN PO Take by mouth.    [provider]  zinc gluconate 50 MG tablet Take 50 mg by mouth daily.    [provider]    Family History Family History  Problem Relation Age of Onset   Hypertension Mother    Hypertension Father    Diabetes Father    Cancer Father        prostate    Social History Social History   Tobacco Use   Smoking status: Never   Smokeless tobacco: Never  Vaping Use   Vaping Use: Never used  Substance Use Topics   Alcohol use: No    Alcohol/week: 0.0 standard drinks of alcohol   Drug use: No     Allergies   Eggs or egg-derived products and Oxycodone   Review of Systems Review of Systems Per HPI  Physical Exam Triage Vital Signs ED Triage Vitals [07/23/21 1532]  Enc Vitals Group     BP (!) 160/95     Pulse Rate 70     Resp 18     Temp 98 F (36.7 C)  Temp Source Oral     SpO2 97 %     Weight      Height      Head Circumference      Peak Flow      Pain Score 0     Pain Loc      Pain Edu?      Excl. in [place]?    No data found.  Updated Vital Signs BP (!) 160/95 (BP Location: Left Arm)   Pulse 70   Temp 98 F (36.7 C) (Oral)   Resp 18   SpO2 97%   Visual Acuity Right Eye Distance:   Left Eye Distance:   Bilateral Distance:    Right Eye Near:   Left Eye Near:    Bilateral Near:     Physical Exam Constitutional:      General: She is not in acute distress.    Appearance: Normal appearance. She is not toxic-appearing or diaphoretic.  HENT:     Head: Normocephalic and atraumatic.     Right Ear: Tympanic membrane and ear canal normal.     Left Ear: Tympanic membrane and ear canal normal.     Nose: Congestion present.     Mouth/Throat:     Mouth:  Mucous membranes are moist.     Pharynx: No posterior oropharyngeal erythema.  Eyes:     Extraocular Movements: Extraocular movements intact.     Conjunctiva/sclera: Conjunctivae normal.     Pupils: Pupils are equal, round, and reactive to light.  Cardiovascular:     Rate and Rhythm: Normal rate and regular rhythm.     Pulses: Normal pulses.     Heart sounds: Normal heart sounds.  Pulmonary:     Effort: Pulmonary effort is normal. No respiratory distress.     Breath sounds: Normal breath sounds. No stridor. No wheezing, rhonchi or rales.  Abdominal:     General: Abdomen is flat. Bowel sounds are normal.     Palpations: Abdomen is soft.  Musculoskeletal:        General: Normal range of motion.     Cervical back: Normal range of motion.  Skin:    General: Skin is warm and dry.  Neurological:     General: No focal deficit present.     Mental Status: She is alert and oriented to person, place, and time. Mental status is at baseline.     Cranial Nerves: Cranial nerves 2-12 are intact.     Sensory: Sensation is intact.     Motor: Motor function is intact.     Coordination: Coordination is intact.     Gait: Gait is intact.  Psychiatric:        Mood and Affect: Mood normal.        Behavior: Behavior normal.      UC Treatments / Results  Labs (all labs ordered are listed, but only abnormal results are displayed) Labs Reviewed  COVID-19, FLU A+B NAA    EKG   Radiology No results found.  Procedures Procedures (including critical care time)  Medications Ordered in UC Medications - No data to display  Initial Impression / Assessment and Plan / UC Course  I have reviewed the triage vital signs and the nursing notes.  Pertinent labs & imaging results that were available during my care of the patient were reviewed by me and considered in my medical decision making (see chart for details).     Patient presents with symptoms likely from a viral upper respiratory  infection.  Differential includes bacterial pneumonia, sinusitis, allergic rhinitis, COVID-19, flu. Do not suspect underlying cardiopulmonary process. Symptoms seem unlikely related to ACS, CHF or COPD exacerbations, pneumonia, pneumothorax. Patient is nontoxic appearing and not in need of emergent medical intervention.  COVID and flu test pending.  Recommended symptom control with over the counter medications that are safe with high blood pressure.  Made suggestions for patient.  Offered patient prescriptions but patient declined.  Patient has mildly elevated blood pressure reading but patient states that she has not taken her blood pressure medication today.  Patient to restart medication at previously prescribed dose.  No signs of hypertensive urgency and no signs of endorgan damage on exam.  Return if symptoms fail to improve in 1-2 weeks or you develop shortness of breath, chest pain, severe headache. Patient states understanding and is agreeable.  Discharged with PCP followup.  Final Clinical Impressions(s) / UC Diagnoses   Final diagnoses:  Viral upper respiratory tract infection with cough     Discharge Instructions      It appears that you have a viral upper respiratory infection that should run its course and self resolve in the next few days with symptomatic treatment.  Recommend Coricidin HBP or plain Mucinex over-the-counter.  COVID and flu test are pending.  We will call if they are positive.  Please follow-up if symptoms persist or worsen.    ED Prescriptions   None    PDMP not reviewed this encounter.   Teodora Medici, [place] 07/23/21 [phone]

## 2021-07-23 NOTE — ED Triage Notes (Signed)
Pt c/o sinus pressure, nasal drainage, cough, fever, chills onset ~ Monday

## 2021-07-23 NOTE — Discharge Instructions (Signed)
It appears that you have a viral upper respiratory infection that should run its course and self resolve in the next few days with symptomatic treatment.  Recommend Coricidin HBP or plain Mucinex over-the-counter.  COVID and flu test are pending.  We will call if they are positive.  Please follow-up if symptoms persist or worsen.

## 2021-07-25 LAB — COVID-19, FLU A+B NAA
Influenza A, NAA: NOT DETECTED
Influenza B, NAA: NOT DETECTED
SARS-CoV-2, NAA: NOT DETECTED

## 2021-08-26 ENCOUNTER — Ambulatory Visit (INDEPENDENT_AMBULATORY_CARE_PROVIDER_SITE_OTHER): Payer: 59 | Admitting: Family

## 2021-08-26 VITALS — BP 122/60 | HR 69 | Temp 97.7°F | Resp 18 | Ht 67.0 in | Wt 186.4 lb

## 2021-08-26 DIAGNOSIS — R202 Paresthesia of skin: Secondary | ICD-10-CM

## 2021-08-26 DIAGNOSIS — R7303 Prediabetes: Secondary | ICD-10-CM | POA: Diagnosis not present

## 2021-08-26 NOTE — Progress Notes (Signed)
Sonya Aguilar is a 51 y.o. female with the following history as recorded in EpicCare:  Patient Active Problem List   Diagnosis Date Noted   Paresthesia 03/01/2021   Chronic idiopathic constipation 11/19/2020   Colon cancer screening 11/19/2020   Abdominal bloating 11/19/2020   Gastroesophageal reflux disease 11/19/2020   Morbid obesity ([place]) 93/26/7124   Periumbilical pain 58/09/9831   Personal history of colonic polyps 11/19/2020   Hot flashes 03/19/2019   Hypertension 12/19/2018   Adenomyosis of uterus 09/11/2018   Bilateral knee pain 10/04/2011    Current Outpatient Medications  Medication Sig Dispense Refill   amLODipine (NORVASC) 5 MG tablet Take 1 tablet (5 mg total) by mouth daily. 90 tablet 1   Cholecalciferol (VITAMIN D) 50 MCG (2000 UT) tablet      NON FORMULARY Take by mouth. Hair Skin and Nails     TURMERIC CURCUMIN PO Take by mouth.     zinc gluconate 50 MG tablet Take 50 mg by mouth daily.     gabapentin (NEURONTIN) 100 MG capsule Take 3 capsules (300 mg total) by mouth at bedtime as needed. (Patient not taking: Reported on 03/30/2021) 90 capsule 3   No current facility-administered medications for this visit.    Allergies: Eggs or egg-derived products and Oxycodone  Past Medical History:  Diagnosis Date   Heart murmur    years ago   Hypertension    Scoliosis     Past Surgical History:  Procedure Laterality Date   CESAREAN SECTION     CESAREAN SECTION     2000   miscarriage     2012    Family History  Problem Relation Age of Onset   Hypertension Mother    Hypertension Father    Diabetes Father    Cancer Father        prostate    Social History   Tobacco Use   Smoking status: Never   Smokeless tobacco: Never  Substance Use Topics   Alcohol use: No    Alcohol/week: 0.0 standard drinks of alcohol    Subjective:  Has been dealing with vague sensations of numbness/ tingling/ paresthesias for the past 1-2 years; has been referred to neurology-  unfortunately has not been able to complete that work up; notes that she had an "episode on Tuesday that involved sensation of upper and lower extremities." Would like to get her labs updated today to make sure that her blood sugar is stable and that this was not cause of symptoms; no blurred vision, no chest pain;     Objective:  Vitals:   08/26/21 1547  BP: 122/60  Pulse: 69  Resp: 18  Temp: 97.7 F (36.5 C)  TempSrc: Temporal  SpO2: 99%  Weight: 186 lb 6.4 oz (84.6 kg)  Height: '5\' 7"'  (1.702 m)    General: Well developed, well nourished, in no acute distress  Skin : Warm and dry.  Head: Normocephalic and atraumatic  Lungs: Respirations unlabored; clear to auscultation bilaterally without wheeze, rales, rhonchi  CVS exam: normal rate and regular rhythm.  Neurologic: Alert and oriented; speech intact; face symmetrical; moves all extremities well; CNII-XII intact without focal deficit   Assessment:  1. Pre-diabetes   2. Paresthesia     Plan:  Update labs today; Referral to neurology- stressed need to complete the work up started with that office.   No follow-ups on file.  Orders Placed This Encounter  Procedures   CBC with Differential/Platelet   Comp Met (CMET)  Hemoglobin A1c   Ambulatory referral to Neurology    Referral Priority:   Routine    Referral Type:   Consultation    Referral Reason:   Specialty Services Required    Referred to Provider:   Marcial Pacas, MD    Requested Specialty:   Neurology    Number of Visits Requested:   1    Requested Prescriptions    No prescriptions requested or ordered in this encounter

## 2021-08-27 ENCOUNTER — Ambulatory Visit: Payer: 59 | Admitting: Family

## 2021-08-27 LAB — COMPREHENSIVE METABOLIC PANEL
ALT: 9 U/L (ref 0–35)
AST: 11 U/L (ref 0–37)
Albumin: 4.2 g/dL (ref 3.5–5.2)
Alkaline Phosphatase: 86 U/L (ref 39–117)
BUN: 11 mg/dL (ref 6–23)
CO2: 29 mEq/L (ref 19–32)
Calcium: 9.1 mg/dL (ref 8.4–10.5)
Chloride: 103 mEq/L (ref 96–112)
Creatinine, Ser: 0.68 mg/dL (ref 0.40–1.20)
GFR: 100.16 mL/min (ref >60.00)
Glucose, Bld: 88 mg/dL (ref 70–99)
Potassium: 3.7 mEq/L (ref 3.5–5.1)
Sodium: 138 mEq/L (ref 135–145)
Total Bilirubin: 0.3 mg/dL (ref 0.2–1.2)
Total Protein: 8.1 g/dL (ref 6.0–8.3)

## 2021-08-27 LAB — HEMOGLOBIN A1C: Hgb A1c MFr Bld: 6.5 % (ref 4.6–6.5)

## 2021-08-27 LAB — CBC WITH DIFFERENTIAL/PLATELET
Basophils Absolute: 0.1 10*3/uL (ref 0.0–0.1)
Basophils Relative: 1.3 % (ref 0.0–3.0)
Eosinophils Absolute: 0.1 10*3/uL (ref 0.0–0.7)
Eosinophils Relative: 1.6 % (ref 0.0–5.0)
HCT: 34.8 % — ABNORMAL LOW (ref 36.0–46.0)
Hemoglobin: 11.4 g/dL — ABNORMAL LOW (ref 12.0–15.0)
Lymphocytes Relative: 35.9 % (ref 12.0–46.0)
Lymphs Abs: 1.9 10*3/uL (ref 0.7–4.0)
MCHC: 32.6 g/dL (ref 30.0–36.0)
MCV: 80.2 fl (ref 78.0–100.0)
Monocytes Absolute: 0.5 10*3/uL (ref 0.1–1.0)
Monocytes Relative: 8.4 % (ref 3.0–12.0)
Neutro Abs: 2.9 10*3/uL (ref 1.4–7.7)
Neutrophils Relative %: 52.8 % (ref 43.0–77.0)
Platelets: 276 10*3/uL (ref 150.0–400.0)
RBC: 4.34 Mil/uL (ref 3.87–5.11)
RDW: 15.4 % (ref 11.5–15.5)
WBC: 5.4 10*3/uL (ref 4.0–10.5)

## 2021-09-01 ENCOUNTER — Other Ambulatory Visit (HOSPITAL_COMMUNITY)
Admission: RE | Admit: 2021-09-01 | Discharge: 2021-09-01 | Disposition: A | Discharge status: Home / Self Care | Payer: 59 | Source: Ambulatory Visit | Attending: Medical | Admitting: Medical

## 2021-09-01 ENCOUNTER — Telehealth: Payer: Self-pay

## 2021-09-01 ENCOUNTER — Encounter (HOSPITAL_BASED_OUTPATIENT_CLINIC_OR_DEPARTMENT_OTHER): Payer: Self-pay | Admitting: Medical

## 2021-09-01 ENCOUNTER — Ambulatory Visit (INDEPENDENT_AMBULATORY_CARE_PROVIDER_SITE_OTHER): Payer: 59 | Admitting: Medical

## 2021-09-01 VITALS — BP 122/86 | HR 81 | Ht 66.25 in | Wt 184.2 lb

## 2021-09-01 DIAGNOSIS — Z124 Encounter for screening for malignant neoplasm of cervix: Secondary | ICD-10-CM | POA: Diagnosis present

## 2021-09-01 DIAGNOSIS — Z01419 Encounter for gynecological examination (general) (routine) without abnormal findings: Secondary | ICD-10-CM | POA: Diagnosis not present

## 2021-09-01 DIAGNOSIS — N939 Abnormal uterine and vaginal bleeding, unspecified: Secondary | ICD-10-CM

## 2021-09-01 NOTE — Progress Notes (Signed)
History:  Ms. Sonya Aguilar is a 51 y.o. G2P0011 who presents to clinic today for annual exam. She is a new patient to our office. She was previously seen for GYN care at [place]. She states last pap smear 2022 was normal and pap smear from 2021 was abnormal. She states additional testing was done, unsure of results. She does not recall any treatment. She agrees to requesting records from that office. She states that she went almost a year without a period and then had a period in November and again in February and May. She denies any previous testing for irregular bleeding. She is also concerned about insomnia and hot flashes. She is not currently sexually active.   The following portions of the patient's history were reviewed and updated as appropriate: allergies, current medications, family history, past medical history, social history, past surgical history and problem list.  Review of Systems:  Review of Systems  Constitutional:  Negative for fever and malaise/fatigue.  Gastrointestinal:  Negative for abdominal pain, constipation, diarrhea, nausea and vomiting.  Genitourinary:  Negative for dysuria, frequency and urgency.       Neg - vaginal bleeding, discharge, pelvic pain      Objective:  Physical Exam BP 122/86   Pulse 81   Ht 5' 6.25" (1.683 m)   Wt 184 lb 3.2 oz (83.6 kg)   LMP 06/25/2021 (Approximate)   BMI 29.51 kg/m  Physical Exam Vitals and nursing note reviewed. Exam conducted with a chaperone present.  Constitutional:      General: She is not in acute distress.    Appearance: Normal appearance. She is well-developed and normal weight.  HENT:     Head: Normocephalic and atraumatic.  Neck:     Thyroid: No thyromegaly.  Cardiovascular:     Rate and Rhythm: Normal rate and regular rhythm.     Heart sounds: No murmur heard. Pulmonary:     Effort: Pulmonary effort is normal. No respiratory distress.     Breath sounds: Normal breath sounds. No wheezing.   Chest:  Breasts:    Right: Normal.     Left: Normal.  Abdominal:     General: Abdomen is flat. Bowel sounds are normal. There is no distension.     Palpations: Abdomen is soft. There is no mass.     Tenderness: There is no abdominal tenderness. There is no guarding or rebound.  Genitourinary:    General: Normal vulva.     Vagina: No vaginal discharge, erythema, tenderness or bleeding.     Cervix: No cervical motion tenderness, discharge, friability, lesion, erythema or cervical bleeding.     Uterus: Not enlarged and not tender.      Adnexa:        Right: No mass or tenderness.         Left: No mass or tenderness.      Musculoskeletal:     Cervical back: Neck supple.  Skin:    General: Skin is warm and dry.     Findings: No erythema.  Neurological:     Mental Status: She is alert and oriented to person, place, and time.  Psychiatric:        Mood and Affect: Mood normal.     Health Maintenance Due  Topic Date Due   COVID-19 Vaccine (1) Never done   HIV Screening  Never done   Hepatitis C Screening  Never done   TETANUS/TDAP  Never done   INFLUENZA VACCINE  08/24/2021  Labs, imaging and previous visits in Epic and Care Everywhere reviewed  Assessment & Plan:  1. Women's annual routine gynecological examination  2. Abnormal uterine bleeding - [facility] - Plan to follow-up with Dr. Sabra Heck for symptom management options in ~ 4 weeks  3. Cervical cancer screening - Cytology - PAP( [place]) - Results will be shared via [place] signed and sent to [place]  Luvenia Redden, PA-C 09/01/2021 4:52 PM

## 2021-09-01 NOTE — Telephone Encounter (Signed)
error 

## 2021-09-02 LAB — FOLLICLE STIMULATING HORMONE: FSH: 66.9 m[IU]/mL

## 2021-09-06 LAB — CYTOLOGY - PAP
Comment: NEGATIVE
Diagnosis: NEGATIVE
High risk HPV: NEGATIVE

## 2021-09-09 ENCOUNTER — Ambulatory Visit (HOSPITAL_BASED_OUTPATIENT_CLINIC_OR_DEPARTMENT_OTHER): Payer: 59 | Admitting: Obstetrics & Gynecology

## 2021-09-13 ENCOUNTER — Other Ambulatory Visit (HOSPITAL_COMMUNITY)
Admission: RE | Admit: 2021-09-13 | Discharge: 2021-09-13 | Disposition: A | Discharge status: Home / Self Care | Payer: 59 | Source: Ambulatory Visit | Attending: Obstetrics & Gynecology | Admitting: Obstetrics & Gynecology

## 2021-09-13 ENCOUNTER — Ambulatory Visit (INDEPENDENT_AMBULATORY_CARE_PROVIDER_SITE_OTHER): Payer: 59 | Admitting: Obstetrics & Gynecology

## 2021-09-13 ENCOUNTER — Encounter (HOSPITAL_BASED_OUTPATIENT_CLINIC_OR_DEPARTMENT_OTHER): Payer: Self-pay | Admitting: Obstetrics & Gynecology

## 2021-09-13 VITALS — BP 152/71 | HR 62 | Ht 67.0 in | Wt 185.4 lb

## 2021-09-13 DIAGNOSIS — N9089 Other specified noninflammatory disorders of vulva and perineum: Secondary | ICD-10-CM

## 2021-09-13 DIAGNOSIS — N950 Postmenopausal bleeding: Secondary | ICD-10-CM

## 2021-09-13 DIAGNOSIS — R232 Flushing: Secondary | ICD-10-CM | POA: Diagnosis not present

## 2021-09-13 MED ORDER — GABAPENTIN 100 MG PO CAPS: ORAL_CAPSULE | ORAL | 0 refills | Status: DC

## 2021-09-13 NOTE — Progress Notes (Signed)
GYNECOLOGY  VISIT  CC:   endometrial biopsy  HPI: 51 y.o. G59P0011 Legally Separated Black or [place] American female here for ***.   Past Medical History:  Diagnosis Date  . Heart murmur    years ago  . Hypertension   . Scoliosis     MEDS:   Current Outpatient Medications on File Prior to Visit  Medication Sig Dispense Refill  . amLODipine (NORVASC) 5 MG tablet Take 1 tablet (5 mg total) by mouth daily. 90 tablet 1  . aspirin EC 81 MG tablet Take 81 mg by mouth daily. Swallow whole.    . Cholecalciferol (VITAMIN D) 50 MCG (2000 UT) tablet     . Cyanocobalamin (B-12 PO) Take by mouth.    Marland Kitchen MAGNESIUM PO Take by mouth.    . NON FORMULARY Take by mouth. Hair Skin and Nails    . TURMERIC CURCUMIN PO Take by mouth.    . zinc gluconate 50 MG tablet Take 50 mg by mouth daily.     No current facility-administered medications on file prior to visit.    ALLERGIES: Eggs or egg-derived products and Oxycodone  SH:  ***  ROS  PHYSICAL EXAMINATION:    BP (!) 152/71   Pulse 62   Ht '5\' 7"'$  (1.702 m)   Wt 185 lb 6.4 oz (84.1 kg)   LMP 07/11/2021 (Approximate)   BMI 29.04 kg/m     General appearance: alert, cooperative and appears stated age Neck: no adenopathy, supple, symmetrical, trachea midline and thyroid {CHL AMB PHY EX THYROID NORM DEFAULT:[phone]::"normal to inspection and palpation"} CV:  {Exam; heart brief:31539} Lungs:  {pe lungs ob:314451} Breasts: {Exam; breast:13139::"normal appearance, no masses or tenderness"} Abdomen: soft, non-tender; bowel sounds normal; no masses,  no organomegaly Lymph:  no inguinal LAD noted  Pelvic: External genitalia:  no lesions              Urethra:  normal appearing urethra with no masses, tenderness or lesions              Bartholins and Skenes: normal                 Vagina: normal appearing vagina with normal color and discharge, no lesions              Cervix: {CHL AMB PHY EX CERVIX NORM DEFAULT:[phone]::"no lesions"}               Bimanual Exam:  Uterus:  {CHL AMB PHY EX UTERUS NORM DEFAULT:[phone]::"normal size, contour, position, consistency, mobility, non-tender"}              Adnexa: {CHL AMB PHY EX ADNEXA NO MASS DEFAULT:[phone]::"no mass, fullness, tenderness"}              Rectovaginal: {yes no:314532}.  Confirms.              Anus:  normal sphincter tone, no lesions  Chaperone, ***, CMA, was present for exam.  Assessment/Plan: 1. Postmenopausal bleeding - Surgical pathology( [place]/ [facility])  2. Hot flashes - gabapentin (NEURONTIN) 100 MG capsule; Take 1 capsule nightly x 3 nights, then increase as needed to 2 capsules x 3 nights.  Then may increased to 3 capsules.  Dispense: 60 capsule; Refill: 0

## 2021-09-15 LAB — SURGICAL PATHOLOGY

## 2021-09-20 ENCOUNTER — Ambulatory Visit (INDEPENDENT_AMBULATORY_CARE_PROVIDER_SITE_OTHER): Payer: 59 | Admitting: Family Medicine

## 2021-09-20 ENCOUNTER — Other Ambulatory Visit: Payer: 59

## 2021-09-20 VITALS — BP 151/79 | Ht 66.25 in | Wt 185.0 lb

## 2021-09-20 DIAGNOSIS — M25562 Pain in left knee: Secondary | ICD-10-CM

## 2021-09-20 DIAGNOSIS — M25561 Pain in right knee: Secondary | ICD-10-CM | POA: Diagnosis not present

## 2021-09-20 NOTE — Progress Notes (Unsigned)
    SUBJECTIVE:   CHIEF COMPLAINT / HPI:   [facility]  At that time, reported bilateral knee pain for "years" but worsening slowly.  Had responded well in the past to steroid injections.  Diagnosed with mild arthritis flare, discussed Tylenol, topical medications, supplements, NSAIDs, and quad strengthening exercises.  Bilateral steroid injections given and tolerated well.  Today patient reports no relief with the previous steroid injections in March.  She actually had some sort of reaction to the cortisone, reports heart palpitations and anxiety for several days afterwards.  She actually had to take a couple days off of work due to the symptoms.  No prior reactions to steroids.  She reports continued "heavy pain" below knee and her knees "cracking like popcorn".  Some left hip pain only with laying on the left side.  Pain worse with using knee, such as when she is getting up from sitting or walking upstairs.  Excedrin gives her good relief, Advil moderately.  No injuries since her last visit.  PERTINENT  PMH / PSH:  Patient Active Problem List   Diagnosis Date Noted   Paresthesia 03/01/2021   Chronic idiopathic constipation 11/19/2020   Colon cancer screening 11/19/2020   Abdominal bloating 11/19/2020   Gastroesophageal reflux disease 11/19/2020   Morbid obesity ([place]) 63/87/5643   Periumbilical pain 32/95/1884   Personal history of colonic polyps 11/19/2020   Hot flashes 03/19/2019   Hypertension 12/19/2018   Adenomyosis of uterus 09/11/2018   Bilateral knee pain 10/04/2011    OBJECTIVE:   BP (!) 151/79   Ht 5' 6.25" (1.683 m)   Wt 185 lb (83.9 kg)   LMP 07/11/2021 (Approximate)   BMI 29.63 kg/m    Left knee Inspection unremarkable bilaterally, no swelling, ecchymoses, or gross deformities Full ROM without pain Normal patellar tracking, loud patellar crepitus evident, mild TTP over patellar tendon Mild tenderness over medial  joint line Nontender over quad tendon, patella body, and lateral joint lines Nontender with varus/valgus stress and internal/external rotation Thessaly positive  right knee Inspection unremarkable bilaterally, no swelling, ecchymoses, or gross deformities Full ROM without pain Normal patellar tracking, loud patellar crepitus evident, no TTP over patellar tendon Mild tenderness over medial joint line Nontender over quad tendon, patella body, and lateral joint lines Nontender with varus/valgus stress and internal/external rotation Thessaly positive  ASSESSMENT/PLAN:   Bilateral knee pain Stable bilateral knee pain and crepitus.  Previous steroid injections in March unhelpful.  History and exam most consistent with arthritis, would be unusual to have equal meniscal tears bilaterally.  Given last imaging >10 years ago, will obtain new bilateral complete knee x-rays including standing views.  Given steroid reaction in March, could be candidate for Zoretta (delayed release steroid for injection).  Will call with results.   Ezequiel Essex, MD

## 2021-09-20 NOTE — Assessment & Plan Note (Addendum)
Stable bilateral knee pain and crepitus.  Previous steroid injections in March unhelpful.  History and exam most consistent with arthritis, would be unusual to have equal meniscal tears bilaterally.  Given last imaging >10 years ago, will obtain new bilateral complete knee x-rays including standing views.  Given steroid reaction in March, could be candidate for Zoretta (delayed release steroid for injection).  Will call with results.

## 2021-09-21 ENCOUNTER — Ambulatory Visit (INDEPENDENT_AMBULATORY_CARE_PROVIDER_SITE_OTHER): Payer: 59

## 2021-09-21 ENCOUNTER — Encounter: Payer: Self-pay | Admitting: Family Medicine

## 2021-09-21 DIAGNOSIS — M25562 Pain in left knee: Secondary | ICD-10-CM

## 2021-09-21 DIAGNOSIS — M25561 Pain in right knee: Secondary | ICD-10-CM | POA: Diagnosis not present

## 2021-09-23 ENCOUNTER — Encounter (HOSPITAL_BASED_OUTPATIENT_CLINIC_OR_DEPARTMENT_OTHER): Payer: Self-pay | Admitting: *Deleted

## 2021-09-23 ENCOUNTER — Encounter: Payer: Self-pay | Admitting: *Deleted

## 2021-10-01 ENCOUNTER — Telehealth: Payer: Self-pay | Admitting: Family

## 2021-10-01 MED ORDER — AMLODIPINE BESYLATE 5 MG PO TABS: 5.0000 mg | ORAL_TABLET | Freq: Every day | ORAL | 1 refills | Status: DC

## 2021-10-01 NOTE — Telephone Encounter (Signed)
Rx sent to pharmacy   

## 2021-10-01 NOTE — Telephone Encounter (Signed)
Patient needing a refill on amLODipine (NORVASC) 5 MG tablet [[id number]]   Pharm: [facility] [address]

## 2021-10-07 ENCOUNTER — Ambulatory Visit (INDEPENDENT_AMBULATORY_CARE_PROVIDER_SITE_OTHER): Payer: 59 | Admitting: Obstetrics & Gynecology

## 2021-10-07 ENCOUNTER — Encounter (HOSPITAL_BASED_OUTPATIENT_CLINIC_OR_DEPARTMENT_OTHER): Payer: Self-pay | Admitting: Obstetrics & Gynecology

## 2021-10-07 ENCOUNTER — Other Ambulatory Visit (HOSPITAL_COMMUNITY)
Admission: RE | Admit: 2021-10-07 | Discharge: 2021-10-07 | Disposition: A | Discharge status: Home / Self Care | Payer: 59 | Source: Ambulatory Visit | Attending: Obstetrics & Gynecology | Admitting: Obstetrics & Gynecology

## 2021-10-07 VITALS — BP 143/83 | HR 79 | Ht 66.0 in | Wt 185.8 lb

## 2021-10-07 DIAGNOSIS — R232 Flushing: Secondary | ICD-10-CM | POA: Diagnosis not present

## 2021-10-07 DIAGNOSIS — N9089 Other specified noninflammatory disorders of vulva and perineum: Secondary | ICD-10-CM | POA: Insufficient documentation

## 2021-10-07 DIAGNOSIS — N901 Moderate vulvar dysplasia: Secondary | ICD-10-CM

## 2021-10-07 NOTE — Progress Notes (Unsigned)
GYNECOLOGY  VISIT  CC:   vulvar biopsy  HPI: 51 y.o. G2P0011 Legally Separated Black or African American female here for vulvar biopsy.  Reviewed with pt that I did get records from [place] but this showed lichen sclerosus.  D/w pt that this is a hypopigmented finding and she has a pigmented lesion.  Feel biopsy still needed.  Consent obtained.  Pt wants to discuss hot flashes as well.  Was prescribed gabapentin but didn't take it.  Was concerned about side effects.  Reviewed these with pt today and starting dosage when using for hot flashes.  She thinks she will try this.  [place] also discussed.  She wants to so some research about this.   Past Medical History:  Diagnosis Date   Heart murmur    years ago   Hypertension    Scoliosis     MEDS:   Current Outpatient Medications on File Prior to Visit  Medication Sig Dispense Refill   amLODipine (NORVASC) 5 MG tablet Take 1 tablet (5 mg total) by mouth daily. 90 tablet 1   aspirin EC 81 MG tablet Take 81 mg by mouth daily. Swallow whole.     Cholecalciferol (VITAMIN D) 50 MCG (2000 UT) tablet      Cyanocobalamin (B-12 PO) Take by mouth.     gabapentin (NEURONTIN) 100 MG capsule Take 1 capsule nightly x 3 nights, then increase as needed to 2 capsules x 3 nights.  Then may increased to 3 capsules. 60 capsule 0   MAGNESIUM PO Take by mouth.     NON FORMULARY Take by mouth. Hair Skin and Nails     TURMERIC CURCUMIN PO Take by mouth.     zinc gluconate 50 MG tablet Take 50 mg by mouth daily.     No current facility-administered medications on file prior to visit.    ALLERGIES: Eggs or egg-derived products and Oxycodone  SH:  separated, non smoker  Review of Systems  Constitutional: Negative.     PHYSICAL EXAMINATION:    BP (!) 143/83 (BP Location: Right Arm, Patient Position: Sitting, Cuff Size: Large)   Pulse 79   Ht '5\' 6"'$  (1.676 m) Comment: Reported  Wt 185 lb 12.8 oz (84.3 kg)   LMP 07/11/2021 (Approximate)   BMI  29.99 kg/m     General appearance: alert, cooperative and appears stated age Lymph:  no inguinal LAD noted  Pelvic: External genitalia:  pigmented lesion about 3.5cmon lower left vulvar region out towards buttocks              Urethra:  normal appearing urethra with no masses, tenderness or lesions              Bartholins and Skenes: normal                  Procedure:  Area cleansed with Betadine.  Sterile technique used throughout procedure.  Skin anesthestized with Lidocaine 1% plain; 51m.    4 punch biopsy used to obtain specimen.  Biopsy grasped with pick-ups and excised with scissors.  Adequate hemostasis obtained with silver nitrate sticks.  Dressing was not applied.  Pt tolerated procedure well.  Chaperone, TOctaviano Batty CMA, was present for exam.  Assessment/Plan: 1. Vulvar lesion - instructions for care given - Surgical pathology( [facility]/ [facility])  2. Hot flashes - pt has rx for gabapentin.  Will decide if she is going to try this and then give update.

## 2021-10-13 LAB — SURGICAL PATHOLOGY

## 2021-10-20 ENCOUNTER — Telehealth (HOSPITAL_BASED_OUTPATIENT_CLINIC_OR_DEPARTMENT_OTHER): Payer: Self-pay | Admitting: Obstetrics & Gynecology

## 2021-10-20 NOTE — Telephone Encounter (Addendum)
Patient called and would like for someone to please call her to schedule her surgery in the OR.

## 2021-10-21 ENCOUNTER — Telehealth: Payer: Self-pay

## 2021-10-21 NOTE — Telephone Encounter (Signed)
Called patient to discuss potential surgery dates, patient opted for 10/31

## 2021-10-27 ENCOUNTER — Encounter (HOSPITAL_BASED_OUTPATIENT_CLINIC_OR_DEPARTMENT_OTHER): Payer: Self-pay

## 2021-11-10 ENCOUNTER — Ambulatory Visit: Payer: 59 | Admitting: Neurology

## 2021-11-10 ENCOUNTER — Ambulatory Visit (INDEPENDENT_AMBULATORY_CARE_PROVIDER_SITE_OTHER): Payer: 59 | Admitting: Neurology

## 2021-11-10 VITALS — BP 149/95 | HR 66 | Ht 67.0 in | Wt 180.0 lb

## 2021-11-10 DIAGNOSIS — R202 Paresthesia of skin: Secondary | ICD-10-CM | POA: Diagnosis not present

## 2021-11-10 NOTE — Procedures (Signed)
Full Name: Sonya Aguilar Gender: Female MRN #: [id number] Date of Birth: 1970-06-10    Visit Date: 11/10/2021 10:20 Age: 51 Years Examining Physician: Marcial Pacas Referring Physician: Marcial Pacas Height: 5 feet 7 inch History: 51 year old female complains of intermittent upper and lower extremity paresthesia  Summary of the test:  Nerve conduction study:  Left sural, superficial peroneal, bilateral median and ulnar sensory responses were normal.  Right median mixed response were within normal limit.  Left tibial, ulnar, bilateral median motor responses were normal.  Electromyography:  Selected needle examinations were performed at left lower extremity muscles, lumbar paraspinal muscles; left upper extremity muscles, and cervical paraspinal muscles.  There was no significant abnormality found.   Conclusion: This is a normal study.  There is no evidence of large fiber peripheral neuropathy, focal neuropathy.    ------------------------------- Marcial Pacas, M.D. Ph.D.  [facility] [address], [place], [place] [address] Tel: [phone] Fax: [phone]  Verbal informed consent was obtained from the patient, patient was informed of potential risk of procedure, including bruising, bleeding, hematoma formation, infection, muscle weakness, muscle pain, numbness, among others.        Sonya Aguilar    Nerve / Sites Muscle Latency Ref. Amplitude Ref. Rel Amp Segments Distance Velocity Ref. Area    ms ms mV mV %  cm m/s m/s mVms  L Median - APB     Wrist APB 3.1 ?4.4 7.0 ?4.0 100 Wrist - APB 7   30.2     Upper arm APB 7.0  6.7  95.3 Upper arm - Wrist 23 59 ?49 28.8  R Median - APB     Wrist APB 3.2 ?4.4 6.6 ?4.0 100 Wrist - APB 7   27.4     Upper arm APB 7.5  5.8  88.3 Upper arm - Wrist 23 54 ?49 23.9  L Ulnar - ADM     Wrist ADM 2.5 ?3.3 10.2 ?6.0 100 Wrist - ADM 7   28.4     B.Elbow ADM 4.9  9.4  91.6 B.Elbow - Wrist 16 67 ?49 28.3     A.Elbow ADM  8.0  8.7  93 A.Elbow - B.Elbow 16 51 ?49 28.4  L Tibial - AH     Ankle AH 3.8 ?5.8 4.7 ?4.0 100 Ankle - AH 9   9.8     Pop fossa AH 14.9  3.9  82.7 Pop fossa - Ankle 46 42 ?41 9.2             SNC    Nerve / Sites Rec. Site Peak Lat Ref.  Amp Ref. Segments Distance Peak Diff Ref.    ms ms V V  cm ms ms  L Sural - Ankle (Calf)     Calf Ankle 3.6 ?4.4 11 ?6 Calf - Ankle 14    L Superficial peroneal - Ankle     Lat leg Ankle 4.0 ?4.4 6 ?6 Lat leg - Ankle 14    R Median, Ulnar - Transcarpal comparison     Median Palm Wrist 1.6 ?2.2 44 ?35 Median Palm - Wrist 8       Ulnar Palm Wrist 1.8 ?2.2 20 ?12 Ulnar Palm - Wrist 8          Median Palm - Ulnar Palm  -0.1 ?0.4  L Median - Orthodromic (Dig II, Mid palm)     Dig II Wrist 2.6 ?3.4 15 ?10 Dig II - Wrist 13  R Median - Orthodromic (Dig II, Mid palm)     Dig II Wrist 3.0 ?3.4 31 ?10 Dig II - Wrist 13    L Ulnar - Orthodromic, (Dig V, Mid palm)     Dig V Wrist 2.5 ?3.1 16 ?5 Dig V - Wrist 11    R Ulnar - Orthodromic, (Dig V, Mid palm)     Dig V Wrist 2.5 ?3.1 9 ?5 Dig V - Wrist 42                     F  Wave    Nerve F Lat Ref.   ms ms  L Ulnar - ADM 27.4 ?32.0  L Tibial - AH 55.9 ?56.0         EMG Summary Table    Spontaneous MUAP Recruitment  Muscle IA Fib PSW Fasc Other Amp Dur. Poly Pattern  L. Tibialis anterior Normal None None None _______ Normal Normal Normal Normal  L. Tibialis posterior Normal None None None _______ Normal Normal Normal Normal  L. Peroneus longus Normal None None None _______ Normal Normal Normal Normal  L. Gastrocnemius (Medial head) Normal None None None _______ Normal Normal Normal Normal  L. Vastus lateralis Normal None None None _______ Normal Normal Normal Normal  L. Lumbar paraspinals (mid) Normal None None None _______ Normal Normal Normal Normal  L. Lumbar paraspinals (low) Normal None None None _______ Normal Normal Normal Normal  L. First dorsal interosseous Normal None None None _______  Normal Normal Normal Normal  L. Biceps brachii Normal None None None _______ Normal Normal Normal Normal  L. Deltoid Normal None None None _______ Normal Normal Normal Normal  L. Triceps brachii Normal None None None _______ Normal Normal Normal Normal  L. Extensor digitorum communis Normal None None None _______ Normal Normal Normal Normal  L. Cervical paraspinals Normal None None None _______ Normal Normal Normal Normal

## 2021-11-10 NOTE — Progress Notes (Signed)
EMG is under procedure note

## 2021-11-12 NOTE — Progress Notes (Signed)
Surgical Instructions    Your procedure is scheduled on Tuesday, 11/23/21.  Report to [facility] Main Entrance "A" at 5:30 A.M., then check in with the Admitting office.  Call this number if you have problems the morning of surgery:  [phone]   If you have any questions prior to your surgery date call [phone]: Open Monday-Friday 8am-4pm If you experience any cold or flu symptoms such as cough, fever, chills, shortness of breath, etc. between now and your scheduled surgery, please notify us at the above number     Remember:  Do not eat after midnight the night before your surgery  You may drink clear liquids until 4:30am the morning of your surgery.   Clear liquids allowed are: Water, Non-Citrus Juices (without pulp), Carbonated Beverages, Clear Tea, Black Coffee ONLY (NO MILK, CREAM OR POWDERED CREAMER of any kind), and Gatorade    Take these medicines the morning of surgery with A SIP OF WATER:  amLODipine (NORVASC) if it is the day you normally take it  As of today, STOP taking any Aspirin (unless otherwise instructed by your surgeon) Aleve, Naproxen, Ibuprofen, Motrin, Advil, Goody's, BC's, all herbal medications, fish oil, and all vitamins.           Do not wear jewelry or makeup. Do not wear lotions, powders, perfumesor deodorant. Do not shave 48 hours prior to surgery.   Do not bring valuables to the hospital. Do not wear nail polish, gel polish, artificial nails, or any other type of covering on natural nails (fingers and toes) If you have artificial nails or gel coating that need to be removed by a nail salon, please have this removed prior to surgery. Artificial nails or gel coating may interfere with anesthesia's ability to adequately monitor your vital signs.  [facility] is not responsible for any belongings or valuables.    Do NOT Smoke (Tobacco/Vaping)  24 hours prior to your procedure  If you use a CPAP at night, you may bring your mask for your overnight  stay.   Contacts, glasses, hearing aids, dentures or partials may not be worn into surgery, please bring cases for these belongings   For patients admitted to the hospital, discharge time will be determined by your treatment team.   Patients discharged the day of surgery will not be allowed to drive home, and someone needs to stay with them for 24 hours.   SURGICAL WAITING ROOM VISITATION Patients having surgery or a procedure may have no more than 2 support people in the waiting area - these visitors may rotate.   Children under the age of 22 must have an adult with them who is not the patient. If the patient needs to stay at the hospital during part of their recovery, the visitor guidelines for inpatient rooms apply. Pre-op nurse will coordinate an appropriate time for 1 support person to accompany patient in pre-op.  This support person may not rotate.   Please refer to [email or web address] for the visitor guidelines for Inpatients (after your surgery is over and you are in a regular room).    Special instructions:    Oral Hygiene is also important to reduce your risk of infection.  Remember - BRUSH YOUR TEETH THE MORNING OF SURGERY WITH YOUR REGULAR TOOTHPASTE   [place]- Preparing For Surgery  Before surgery, you can play an important role. Because skin is not sterile, your skin needs to be as free of germs as possible. You can reduce the number of germs  on your skin by washing with CHG (chlorahexidine gluconate) Soap before surgery.  CHG is an antiseptic cleaner which kills germs and bonds with the skin to continue killing germs even after washing.     Please do not use if you have an allergy to CHG or antibacterial soaps. If your skin becomes reddened/irritated stop using the CHG.  Do not shave (including legs and underarms) for at least 48 hours prior to first CHG shower. It is OK to shave your face.  Please follow these  instructions carefully.     Shower the NIGHT BEFORE SURGERY and the MORNING OF SURGERY with CHG Soap.   If you chose to wash your hair, wash your hair first as usual with your normal shampoo. After you shampoo, rinse your hair and body thoroughly to remove the shampoo.  Then [employer] and genitals (private parts) with your normal soap and rinse thoroughly to remove soap.  After that Use CHG Soap as you would any other liquid soap. You can apply CHG directly to the skin and wash gently with a scrungie or a clean washcloth.   Apply the CHG Soap to your body ONLY FROM THE NECK DOWN.  Do not use on open wounds or open sores. Avoid contact with your eyes, ears, mouth and genitals (private parts). Wash Face and genitals (private parts)  with your normal soap.   Wash thoroughly, paying special attention to the area where your surgery will be performed.  Thoroughly rinse your body with warm water from the neck down.  DO NOT shower/wash with your normal soap after using and rinsing off the CHG Soap.  Pat yourself dry with a CLEAN TOWEL.  Wear CLEAN PAJAMAS to bed the night before surgery  Place CLEAN SHEETS on your bed the night before your surgery  DO NOT SLEEP WITH PETS.   Day of Surgery: Take a shower with CHG soap. Wear Clean/Comfortable clothing the morning of surgery Do not apply any deodorants/lotions.   Remember to brush your teeth WITH YOUR REGULAR TOOTHPASTE.    If you received a COVID test during your pre-op visit, it is requested that you wear a mask when out in public, stay away from anyone that may not be feeling well, and notify your surgeon if you develop symptoms. If you have been in contact with anyone that has tested positive in the last 10 days, please notify your surgeon.    Please read over the following fact sheets that you were given.

## 2021-11-14 NOTE — Progress Notes (Signed)
EMG report is under procedure 

## 2021-11-15 ENCOUNTER — Encounter (HOSPITAL_COMMUNITY): Payer: Self-pay | Admitting: *Deleted

## 2021-11-15 ENCOUNTER — Other Ambulatory Visit: Payer: Self-pay

## 2021-11-15 ENCOUNTER — Encounter (HOSPITAL_COMMUNITY)
Admission: RE | Admit: 2021-11-15 | Discharge: 2021-11-15 | Disposition: A | Discharge status: Home / Self Care | Payer: 59 | Source: Ambulatory Visit | Attending: Obstetrics & Gynecology | Admitting: Obstetrics & Gynecology

## 2021-11-15 VITALS — BP 138/77 | HR 92 | Temp 98.0°F | Resp 18 | Ht 67.0 in | Wt 184.6 lb

## 2021-11-15 DIAGNOSIS — I10 Essential (primary) hypertension: Secondary | ICD-10-CM

## 2021-11-15 DIAGNOSIS — Z01818 Encounter for other preprocedural examination: Secondary | ICD-10-CM

## 2021-11-15 LAB — CBC
HCT: 35.8 % — ABNORMAL LOW (ref 36.0–46.0)
Hemoglobin: 11.5 g/dL — ABNORMAL LOW (ref 12.0–15.0)
MCH: 26.6 pg (ref 26.0–34.0)
MCHC: 32.1 g/dL (ref 30.0–36.0)
MCV: 82.7 fL (ref 80.0–100.0)
Platelets: 292 10*3/uL (ref 150–400)
RBC: 4.33 MIL/uL (ref 3.87–5.11)
RDW: 15 % (ref 11.5–15.5)
WBC: 6.4 10*3/uL (ref 4.0–10.5)
nRBC: 0 % (ref 0.0–0.2)

## 2021-11-15 LAB — BASIC METABOLIC PANEL
Anion gap: 9 (ref 5–15)
BUN: 8 mg/dL (ref 6–20)
CO2: 26 mmol/L (ref 22–32)
Calcium: 9.2 mg/dL (ref 8.9–10.3)
Chloride: 105 mmol/L (ref 98–111)
Creatinine, Ser: 0.74 mg/dL (ref 0.44–1.00)
GFR, Estimated: >60 mL/min (ref >60)
Glucose, Bld: 120 mg/dL — ABNORMAL HIGH (ref 70–99)
Potassium: 3.2 mmol/L — ABNORMAL LOW (ref 3.5–5.1)
Sodium: 140 mmol/L (ref 135–145)

## 2021-11-15 NOTE — Progress Notes (Signed)
PCP - Jodi Mourning Cardiologist - denies  PPM/ICD - denies   Chest x-ray - n/a EKG - 11/15/21 Stress Test - denies ECHO - denies Cardiac Cath - denies  Sleep Study - denies  Follow your surgeon's instructions on when to stop Aspirin.  If no instructions were given by your surgeon then you will need to call the office to get those instructions.     ERAS Protcol -yes PRE-SURGERY Ensure or G2- none ordered  COVID TEST- not needed   Anesthesia review: no  Patient denies shortness of breath, fever, cough and chest pain at PAT appointment   All instructions explained to the patient, with a verbal understanding of the material. Patient agrees to go over the instructions while at home for a better understanding. Patient also instructed to self quarantine after being tested for COVID-19. The opportunity to ask questions was provided. '

## 2021-11-17 ENCOUNTER — Other Ambulatory Visit (HOSPITAL_BASED_OUTPATIENT_CLINIC_OR_DEPARTMENT_OTHER): Payer: Self-pay | Admitting: *Deleted

## 2021-11-17 ENCOUNTER — Other Ambulatory Visit (HOSPITAL_BASED_OUTPATIENT_CLINIC_OR_DEPARTMENT_OTHER): Payer: Self-pay | Admitting: Obstetrics & Gynecology

## 2021-11-17 DIAGNOSIS — Z01818 Encounter for other preprocedural examination: Secondary | ICD-10-CM

## 2021-11-17 DIAGNOSIS — N903 Dysplasia of vulva, unspecified: Secondary | ICD-10-CM

## 2021-11-17 MED ORDER — POTASSIUM CHLORIDE CRYS ER 10 MEQ PO TBCR: 10.0000 meq | EXTENDED_RELEASE_TABLET | Freq: Every day | ORAL | 0 refills | Status: DC

## 2021-11-17 NOTE — Progress Notes (Signed)
Rx sent to pharmacy for potassium per Dr. Ammie Ferrier request

## 2021-11-23 ENCOUNTER — Other Ambulatory Visit: Payer: Self-pay

## 2021-11-23 ENCOUNTER — Ambulatory Visit (HOSPITAL_COMMUNITY)
Admission: RE | Admit: 2021-11-23 | Discharge: 2021-11-23 | Disposition: A | Discharge status: Home / Self Care | Payer: 59 | Attending: Obstetrics & Gynecology | Admitting: Obstetrics & Gynecology

## 2021-11-23 ENCOUNTER — Encounter (HOSPITAL_COMMUNITY)
Admission: RE | Disposition: A | Discharge status: Home / Self Care | Payer: Self-pay | Source: Home / Self Care | Attending: Obstetrics & Gynecology

## 2021-11-23 ENCOUNTER — Encounter (HOSPITAL_COMMUNITY): Payer: Self-pay | Admitting: Obstetrics & Gynecology

## 2021-11-23 ENCOUNTER — Ambulatory Visit (HOSPITAL_BASED_OUTPATIENT_CLINIC_OR_DEPARTMENT_OTHER): Payer: 59 | Admitting: Certified Registered Nurse Anesthetist

## 2021-11-23 ENCOUNTER — Ambulatory Visit (HOSPITAL_COMMUNITY): Payer: 59 | Admitting: Certified Registered Nurse Anesthetist

## 2021-11-23 DIAGNOSIS — Z01818 Encounter for other preprocedural examination: Secondary | ICD-10-CM

## 2021-11-23 DIAGNOSIS — N901 Moderate vulvar dysplasia: Secondary | ICD-10-CM

## 2021-11-23 DIAGNOSIS — I10 Essential (primary) hypertension: Secondary | ICD-10-CM

## 2021-11-23 DIAGNOSIS — D485 Neoplasm of uncertain behavior of skin: Secondary | ICD-10-CM

## 2021-11-23 DIAGNOSIS — Z87412 Personal history of vulvar dysplasia: Secondary | ICD-10-CM

## 2021-11-23 DIAGNOSIS — D071 Carcinoma in situ of vulva: Secondary | ICD-10-CM | POA: Diagnosis not present

## 2021-11-23 DIAGNOSIS — N903 Dysplasia of vulva, unspecified: Secondary | ICD-10-CM

## 2021-11-23 DIAGNOSIS — D235 Other benign neoplasm of skin of trunk: Secondary | ICD-10-CM | POA: Diagnosis not present

## 2021-11-23 HISTORY — PX: COLPOSCOPY: SHX161

## 2021-11-23 HISTORY — PX: VULVA /PERINEUM BIOPSY: SHX319

## 2021-11-23 LAB — CBC
HCT: 35.4 % — ABNORMAL LOW (ref 36.0–46.0)
Hemoglobin: 11.6 g/dL — ABNORMAL LOW (ref 12.0–15.0)
MCH: 26.7 pg (ref 26.0–34.0)
MCHC: 32.8 g/dL (ref 30.0–36.0)
MCV: 81.6 fL (ref 80.0–100.0)
Platelets: 285 10*3/uL (ref 150–400)
RBC: 4.34 MIL/uL (ref 3.87–5.11)
RDW: 15.2 % (ref 11.5–15.5)
WBC: 6.8 10*3/uL (ref 4.0–10.5)
nRBC: 0 % (ref 0.0–0.2)

## 2021-11-23 SURGERY — BIOPSY, VULVA
Anesthesia: General | Site: Vulva

## 2021-11-23 MED ORDER — KETOROLAC TROMETHAMINE 30 MG/ML IJ SOLN
30.0000 mg | Freq: Once | INTRAMUSCULAR | Status: AC
  Administered 2021-11-23: 30 mg via INTRAVENOUS

## 2021-11-23 MED ORDER — LIDOCAINE 2% (20 MG/ML) 5 ML SYRINGE
INTRAMUSCULAR | Status: AC
  Filled 2021-11-23: qty 5

## 2021-11-23 MED ORDER — ACETIC ACID 4% SOLUTION
Status: DC | PRN
  Administered 2021-11-23: 1 via TOPICAL

## 2021-11-23 MED ORDER — PROPOFOL 10 MG/ML IV BOLUS
INTRAVENOUS | Status: AC
  Filled 2021-11-23: qty 20

## 2021-11-23 MED ORDER — 0.9 % SODIUM CHLORIDE (POUR BTL) OPTIME
TOPICAL | Status: DC | PRN
  Administered 2021-11-23: 1000 mL

## 2021-11-23 MED ORDER — ACETIC ACID 5 % SOLN
Status: AC
  Filled 2021-11-23: qty 500

## 2021-11-23 MED ORDER — KETOROLAC TROMETHAMINE 30 MG/ML IJ SOLN
INTRAMUSCULAR | Status: AC
  Filled 2021-11-23: qty 1

## 2021-11-23 MED ORDER — ACETAMINOPHEN 650 MG RE SUPP: 650.0000 mg | RECTAL | Status: DC | PRN

## 2021-11-23 MED ORDER — MIDAZOLAM HCL 2 MG/2ML IJ SOLN
INTRAMUSCULAR | Status: DC | PRN
  Administered 2021-11-23: 2 mg via INTRAVENOUS

## 2021-11-23 MED ORDER — DEXAMETHASONE SODIUM PHOSPHATE 10 MG/ML IJ SOLN
INTRAMUSCULAR | Status: AC
  Filled 2021-11-23: qty 1

## 2021-11-23 MED ORDER — SUCCINYLCHOLINE CHLORIDE 200 MG/10ML IV SOSY
PREFILLED_SYRINGE | INTRAVENOUS | Status: AC
  Filled 2021-11-23: qty 10

## 2021-11-23 MED ORDER — FENTANYL CITRATE (PF) 250 MCG/5ML IJ SOLN
INTRAMUSCULAR | Status: AC
  Filled 2021-11-23: qty 5

## 2021-11-23 MED ORDER — PROPOFOL 10 MG/ML IV BOLUS
INTRAVENOUS | Status: DC | PRN
  Administered 2021-11-23: 200 mg via INTRAVENOUS

## 2021-11-23 MED ORDER — LIDOCAINE HCL (PF) 1 % IJ SOLN
INTRAMUSCULAR | Status: AC
  Filled 2021-11-23: qty 30

## 2021-11-23 MED ORDER — HYDROCODONE-ACETAMINOPHEN 5-325 MG PO TABS: 1.0000 | ORAL_TABLET | Freq: Four times a day (QID) | ORAL | 0 refills | Status: DC | PRN

## 2021-11-23 MED ORDER — PHENYLEPHRINE 80 MCG/ML (10ML) SYRINGE FOR IV PUSH (FOR BLOOD PRESSURE SUPPORT)
PREFILLED_SYRINGE | INTRAVENOUS | Status: AC
  Filled 2021-11-23: qty 10

## 2021-11-23 MED ORDER — LIDOCAINE HCL 1 % IJ SOLN
INTRAMUSCULAR | Status: DC | PRN
  Administered 2021-11-23: 10 mL

## 2021-11-23 MED ORDER — ONDANSETRON HCL 4 MG/2ML IJ SOLN
INTRAMUSCULAR | Status: AC
  Filled 2021-11-23: qty 2

## 2021-11-23 MED ORDER — LIDOCAINE 2% (20 MG/ML) 5 ML SYRINGE
INTRAMUSCULAR | Status: DC | PRN
  Administered 2021-11-23: 100 mg via INTRAVENOUS

## 2021-11-23 MED ORDER — DEXAMETHASONE SODIUM PHOSPHATE 10 MG/ML IJ SOLN
INTRAMUSCULAR | Status: DC | PRN
  Administered 2021-11-23: 10 mg via INTRAVENOUS

## 2021-11-23 MED ORDER — CEFAZOLIN SODIUM-DEXTROSE 2-4 GM/100ML-% IV SOLN
2.0000 g | INTRAVENOUS | Status: AC
  Administered 2021-11-23: 2 g via INTRAVENOUS

## 2021-11-23 MED ORDER — ORAL CARE MOUTH RINSE: 15.0000 mL | Freq: Once | OROMUCOSAL | Status: AC

## 2021-11-23 MED ORDER — IODINE STRONG (LUGOLS) 5 % PO SOLN
ORAL | Status: AC
  Filled 2021-11-23: qty 1

## 2021-11-23 MED ORDER — FENTANYL CITRATE (PF) 250 MCG/5ML IJ SOLN
INTRAMUSCULAR | Status: DC | PRN
  Administered 2021-11-23: 100 ug via INTRAVENOUS

## 2021-11-23 MED ORDER — ONDANSETRON HCL 4 MG/2ML IJ SOLN
INTRAMUSCULAR | Status: DC | PRN
  Administered 2021-11-23: 4 mg via INTRAVENOUS

## 2021-11-23 MED ORDER — POVIDONE-IODINE 10 % EX SWAB
2.0000 | Freq: Once | CUTANEOUS | Status: AC
  Administered 2021-11-23: 2 via TOPICAL

## 2021-11-23 MED ORDER — MIDAZOLAM HCL 2 MG/2ML IJ SOLN
INTRAMUSCULAR | Status: AC
  Filled 2021-11-23: qty 2

## 2021-11-23 MED ORDER — CHLORHEXIDINE GLUCONATE 0.12 % MT SOLN
OROMUCOSAL | Status: AC
  Administered 2021-11-23: 15 mL via OROMUCOSAL
  Filled 2021-11-23: qty 15

## 2021-11-23 MED ORDER — ACETAMINOPHEN 325 MG PO TABS: 650.0000 mg | ORAL_TABLET | ORAL | Status: DC | PRN

## 2021-11-23 MED ORDER — FERRIC SUBSULFATE 259 MG/GM EX SOLN
CUTANEOUS | Status: AC
  Filled 2021-11-23: qty 8

## 2021-11-23 MED ORDER — CEFAZOLIN SODIUM-DEXTROSE 2-4 GM/100ML-% IV SOLN
INTRAVENOUS | Status: AC
  Filled 2021-11-23: qty 100

## 2021-11-23 MED ORDER — IBUPROFEN 600 MG PO TABS: 600.0000 mg | ORAL_TABLET | Freq: Four times a day (QID) | ORAL | 0 refills | Status: DC | PRN

## 2021-11-23 MED ORDER — SILVER NITRATE-POT NITRATE 75-25 % EX MISC
CUTANEOUS | Status: DC | PRN
  Administered 2021-11-23 (×2): 1

## 2021-11-23 MED ORDER — PHENYLEPHRINE 80 MCG/ML (10ML) SYRINGE FOR IV PUSH (FOR BLOOD PRESSURE SUPPORT)
PREFILLED_SYRINGE | INTRAVENOUS | Status: DC | PRN
  Administered 2021-11-23 (×8): 80 ug via INTRAVENOUS

## 2021-11-23 MED ORDER — ATROPINE SULFATE 0.4 MG/ML IV SOLN
INTRAVENOUS | Status: AC
  Filled 2021-11-23: qty 1

## 2021-11-23 MED ORDER — LACTATED RINGERS IV SOLN: INTRAVENOUS | Status: DC

## 2021-11-23 MED ORDER — CHLORHEXIDINE GLUCONATE 0.12 % MT SOLN: 15.0000 mL | Freq: Once | OROMUCOSAL | Status: AC

## 2021-11-23 SURGICAL SUPPLY — 26 items
APL SWBSTK 6 STRL LF DISP (MISCELLANEOUS)
APPLICATOR COTTON TIP 6 STRL (MISCELLANEOUS) IMPLANT
APPLICATOR COTTON TIP 6IN STRL (MISCELLANEOUS)
BLADE SURG 15 STRL LF DISP TIS (BLADE) ×1 IMPLANT
BLADE SURG 15 STRL SS (BLADE) ×1
CNTNR URN SCR LID CUP LEK RST (MISCELLANEOUS) IMPLANT
CONT SPEC 4OZ STRL OR WHT (MISCELLANEOUS) ×4
ELECT REM PT RETURN 9FT ADLT (ELECTROSURGICAL)
ELECTRODE REM PT RTRN 9FT ADLT (ELECTROSURGICAL) IMPLANT
GLOVE BIOGEL PI IND STRL 7.0 (GLOVE) ×2 IMPLANT
GLOVE ECLIPSE 6.5 STRL STRAW (GLOVE) ×2 IMPLANT
GOWN STRL REUS W/ TWL LRG LVL3 (GOWN DISPOSABLE) ×2 IMPLANT
GOWN STRL REUS W/TWL LRG LVL3 (GOWN DISPOSABLE) ×2
NEEDLE HYPO 22GX1.5 SAFETY (NEEDLE) IMPLANT
NS IRRIG 1000ML POUR BTL (IV SOLUTION) ×1 IMPLANT
PACK VAGINAL MINOR WOMEN LF (CUSTOM PROCEDURE TRAY) ×1 IMPLANT
PAD OB MATERNITY 4.3X12.25 (PERSONAL CARE ITEMS) ×1 IMPLANT
PENCIL BUTTON HOLSTER BLD 10FT (ELECTRODE) IMPLANT
SCOPETTES 8  STERILE (MISCELLANEOUS)
SCOPETTES 8 STERILE (MISCELLANEOUS) IMPLANT
SUT VIC AB 1 CT1 36 (SUTURE) IMPLANT
SUT VIC AB 2-0 SH 27 (SUTURE) ×2
SUT VIC AB 2-0 SH 27XBRD (SUTURE) IMPLANT
TOWEL GREEN STERILE FF (TOWEL DISPOSABLE) ×2 IMPLANT
TUBE CONNECTING 12X1/4 (SUCTIONS) IMPLANT
YANKAUER SUCT BULB TIP NO VENT (SUCTIONS) IMPLANT

## 2021-11-23 NOTE — Transfer of Care (Signed)
Immediate Anesthesia Transfer of Care Note  Patient: Sonya Aguilar  Procedure(s) Performed: WIDE LOCAL EXCISION OF VULVAR VIN (Vulva) VULVAR COLPOSCOPY (Vulva)  Patient Location: PACU  Anesthesia Type:General  Level of Consciousness: drowsy and patient cooperative  Airway & Oxygen Therapy: Patient Spontanous Breathing and Patient connected to nasal cannula oxygen  Post-op Assessment: Report given to RN, Post -op Vital signs reviewed and stable, and Patient moving all extremities X 4  Post vital signs: Reviewed and stable  Last Vitals:  Vitals Value Taken Time  BP 116/74 11/23/21 0825  Temp    Pulse 69 11/23/21 0828  Resp 18 11/23/21 0828  SpO2 100 % 11/23/21 0828  Vitals shown include unvalidated device data.  Last Pain:  Vitals:   11/23/21 0622  TempSrc:   PainSc: 0-No pain         Complications: No notable events documented.

## 2021-11-23 NOTE — Anesthesia Procedure Notes (Signed)
Procedure Name: LMA Insertion Date/Time: 11/23/2021 7:37 AM  Performed by: Michele Rockers, CRNAPre-anesthesia Checklist: Patient identified, Emergency Drugs available, Suction available, Timeout performed and Patient being monitored Patient Re-evaluated:Patient Re-evaluated prior to induction Oxygen Delivery Method: Circle system utilized Preoxygenation: Pre-oxygenation with 100% oxygen Induction Type: IV induction Ventilation: Mask ventilation without difficulty LMA: LMA inserted LMA Size: 4.0 Number of attempts: 1 Placement Confirmation: positive ETCO2 and breath sounds checked- equal and bilateral Tube secured with: Tape Dental Injury: Teeth and Oropharynx as per pre-operative assessment

## 2021-11-23 NOTE — H&P (Signed)
Sonya Aguilar is an 51 y.o. female G2P1 legally separate AA female here for vulvar colposcopy and excision of vulvar dysplasia.  Procedure, risks and benefits have been discussed.  Infection, bleeding, bruising and need for future procedures have been discussed.    Pertinent Gynecological History: Menses: post-menopausal Bleeding: none Contraception: abstinence DES exposure: denies Blood transfusions: none Sexually transmitted diseases: no past history Last mammogram: normal Date: 4/202 Last pap: normal Date: 08/2021 OB History: G2, P1   Menstrual History: Patient's last menstrual period was 07/11/2021 (approximate).    Past Medical History:  Diagnosis Date   Heart murmur    years ago   Hypertension    Scoliosis     Past Surgical History:  Procedure Laterality Date   CESAREAN SECTION     CESAREAN SECTION     2000   DILATION AND CURETTAGE OF UTERUS     miscarriage     2012    Family History  Problem Relation Age of Onset   Hypertension Father    Diabetes Father    Cancer Father        prostate   Diabetes Mother    Hypertension Mother     Social History:  reports that she has never smoked. She has never used smokeless tobacco. She reports that she does not drink alcohol and does not use drugs.  Allergies:  Allergies  Allergen Reactions   Eggs Or Egg-Derived Products Nausea Only   Oxycodone Nausea And Vomiting    upset stomach     Medications Prior to Admission  Medication Sig Dispense Refill Last Dose   amLODipine (NORVASC) 5 MG tablet Take 1 tablet (5 mg total) by mouth daily. (Patient taking differently: Take 5 mg by mouth every other day.) 90 tablet 1 11/23/2021 at 0525   aspirin-acetaminophen-caffeine (EXCEDRIN MIGRAINE) 250-250-65 MG tablet Take 1 tablet by mouth every 6 (six) hours as needed for headache.   Past Week   Cholecalciferol (VITAMIN D3 PO) Take 1 capsule by mouth daily.   Past Week   Cyanocobalamin (B-12 PO) Take 1 tablet by mouth daily.    Past Week   DM-APAP-CPM (CORICIDIN HBP PO) Take 1 tablet by mouth daily as needed (allergies).   Past Week   MAGNESIUM PO Take 1 capsule by mouth daily.   Past Week   Multiple Vitamins-Minerals (ZINC PO) Take 1 tablet by mouth daily.   Past Week   TURMERIC CURCUMIN PO Take 1 capsule by mouth daily.   Past Week   aspirin EC 81 MG tablet Take 81 mg by mouth daily. Swallow whole.      gabapentin (NEURONTIN) 100 MG capsule Take 1 capsule nightly x 3 nights, then increase as needed to 2 capsules x 3 nights.  Then may increased to 3 capsules. (Patient not taking: Reported on 11/12/2021) 60 capsule 0 Not Taking   potassium chloride (KLOR-CON M) 10 MEQ tablet Take 1 tablet (10 mEq total) by mouth daily for 4 days. 4 tablet 0     Review of Systems  All other systems reviewed and are negative.   Blood pressure (!) 155/101, pulse 75, temperature 98.7 F (37.1 C), temperature source Oral, resp. rate 18, height '5\' 7"'$  (1.702 m), weight 83.5 kg, last menstrual period 07/11/2021, SpO2 99 %. Physical Exam Constitutional:      Appearance: Normal appearance.  Cardiovascular:     Rate and Rhythm: Normal rate and regular rhythm.  Pulmonary:     Effort: Pulmonary effort is normal.  Breath sounds: Normal breath sounds.  Neurological:     General: No focal deficit present.     Mental Status: She is alert.  Psychiatric:        Mood and Affect: Mood normal.     Results for orders placed or performed during the hospital encounter of 11/23/21 (from the past 24 hour(s))  CBC     Status: Abnormal   Collection Time: 11/23/21  6:02 AM  Result Value Ref Range   WBC 6.8 4.0 - 10.5 K/uL   RBC 4.34 3.87 - 5.11 MIL/uL   Hemoglobin 11.6 (L) 12.0 - 15.0 g/dL   HCT 35.4 (L) 36.0 - 46.0 %   MCV 81.6 80.0 - 100.0 fL   MCH 26.7 26.0 - 34.0 pg   MCHC 32.8 30.0 - 36.0 g/dL   RDW 15.2 11.5 - 15.5 %   Platelets 285 150 - 400 K/uL   nRBC 0.0 0.0 - 0.2 %    No results found.  Assessment/Plan: 51 yo G2P1 AA  female here for excision of vulvar dysplasia and vulvar colposcopy with possible additional biopsies. Questions answered.  Pt here and ready to proceed.  Megan Salon 11/23/2021, 7:08 AM

## 2021-11-23 NOTE — Anesthesia Preprocedure Evaluation (Signed)
Anesthesia Evaluation  Patient identified by MRN, date of birth, ID band Patient awake    Reviewed: Allergy & Precautions, NPO status , Patient's Chart, lab work & pertinent test results  Airway Mallampati: II       Dental   Pulmonary neg pulmonary ROS,    breath sounds clear to auscultation       Cardiovascular hypertension, + Valvular Problems/Murmurs  Rhythm:Regular Rate:Normal     Neuro/Psych    GI/Hepatic Neg liver ROS, GERD  ,  Endo/Other  negative endocrine ROS  Renal/GU negative Renal ROS     Musculoskeletal   Abdominal   Peds  Hematology   Anesthesia Other Findings   Reproductive/Obstetrics                             Anesthesia Physical Anesthesia Plan  ASA: 3  Anesthesia Plan: General   Post-op Pain Management:    Induction: Intravenous  PONV Risk Score and Plan: 3 and Ondansetron, Dexamethasone and Midazolam  Airway Management Planned: Oral ETT  Additional Equipment:   Intra-op Plan:   Post-operative Plan:   Informed Consent: I have reviewed the patients History and Physical, chart, labs and discussed the procedure including the risks, benefits and alternatives for the proposed anesthesia with the patient or authorized representative who has indicated his/her understanding and acceptance.       Plan Discussed with: CRNA and Anesthesiologist  Anesthesia Plan Comments:         Anesthesia Quick Evaluation

## 2021-11-23 NOTE — Discharge Instructions (Signed)
Post-surgical Instructions, Outpatient Surgery  You may expect to feel dizzy, weak, and drowsy for as long as 24 hours after receiving the medicine that made you sleep (anesthetic). For the first 24 hours after your surgery:   Do not drive a car, ride a bicycle, participate in physical activities, or take public transportation until you are done taking narcotic pain medicines or as directed by Dr. Sabra Heck.  Do not drink alcohol or take tranquilizers.  Do not take medicine that has not been prescribed by your physicians.  Do not sign important papers or make important decisions while on narcotic pain medicines.  Have a responsible person with you.   CARE OF INCISION If you have a bandage, you may remove it in one day.  If there are steri-strips or dermabond, just let this loosen on its own.  You may shower on the first day after your surgery.  Do not sit in a tub bath for one week. Avoid heavy lifting (more than 10 pounds/4.5 kilograms), pushing, or pulling.  Avoid activities that may risk injury to your incisions.   PAIN MANAGEMENT Motrin '600mg'$ .  (This is the same as three-'200mg'$  over the counter tablets of Motrin or ibuprofen.)  You may take this every eight hours or as needed for cramping.  If you do not need the narcotic pain medication, you can alternate the motrin with tylenol '1000mg'$  (two extra strength tablets) every six hours.  This allows you to take something every 3 hours as needed. Vicodin 5/'325mg'$ .  For more severe pain, take one or two tablets every four to six hours as needed for pain control.  (Remember that narcotic pain medications increase your risk of constipation.  If this becomes a problem, you may take an over the counter stool softener like Colace '100mg'$  up to four times a day.)  DO'S AND DON'T'S Do not take a tub bath for one week.  You may shower on the first day after your surgery Do not do any heavy lifting for one to two weeks.  This increases the chance of bleeding. Do  move around as you feel able.  Stairs are fine.  You may begin to exercise again as you feel able.  Do not lift any weights for two weeks. Do not put anything in the vagina for two weeks--no tampons, intercourse, or douching.    REGULAR MEDIATIONS/VITAMINS: You may restart all of your regular medications as prescribed. You may restart all of your vitamins as you normally take them.    PLEASE CALL OR SEEK MEDICAL CARE IF: You have persistent nausea and vomiting.  You have trouble eating or drinking.  You have an oral temperature above 100.5.  You have constipation that is not helped by adjusting diet or increasing fluid intake. Pain medicines are a common cause of constipation.  You have heavy vaginal bleeding You have redness or drainage from your incision(s) or there is increasing pain or tenderness near or in the surgical site.

## 2021-11-23 NOTE — Op Note (Signed)
11/23/2021  8:28 AM  PATIENT:  Sonya Aguilar  51 y.o. female  PRE-OPERATIVE DIAGNOSIS:  VIN2  POST-OPERATIVE DIAGNOSIS:  VIN2  PROCEDURE:  Procedure(s): WIDE LOCAL EXCISION OF VULVAR VIN VULVAR COLPOSCOPY VULVAR BIOPSIES x 3  SURGEON:  Megan Salon  ASSISTANTS: OR staff.    ANESTHESIA:   general  ESTIMATED BLOOD LOSS: 10 mL  BLOOD ADMINISTERED:none   FLUIDS: 1000cc LR  UOP: voided before coming back to the OR  SPECIMEN:  VIN excision and biopsies x 3.  Excision at left inferior vulvar with suture at 12 o'clock.  Biopsies at left buttocks, right buttocks and right inferior vulva  DISPOSITION OF SPECIMEN:  PATHOLOGY  FINDINGS: area of raised and pigmented vulvar skin previously biopsies as VIN.  This is left, lateral and inferior to vagina.  Excision area measured 4.25cm.  Two pigmented lesions on buttocks more consistent with nevi.  Pigmented area to right and inferior to vagina.  Biopsy of this site only.  Lesions to appear to be nevi were fully removed  DESCRIPTION OF OPERATION: Patient was taken to the operating room.  She is placed in the supine position. SCDs were on her lower extremities and functioning properly. General anesthesia with an LMA was administered without difficulty. Dr. Nyoka Cowden, anesthesia, oversaw case.  Time out performed.  Legs were then placed in the [place] in the low lithotomy position. The legs were lifted to the high lithotomy.  5% acetic acid was placed on the vulva with sterile gauze for about 3 minutes.  Then a vulvar colposcopy was performed.  The above findings were noted.  I then stepped away from the patient and Betadine prep was used to prep the inner thighs, perineum and vagina x3.  Patient was draped in a normal standard fashion.  No catheterization was performed as the patient had voided before going back to the operating room.  Time out was performed again.  Sterile gown and gloves were placed.  1% lidocaine was used to  anesthetize the areas for excision and biopsy.  The area of VIN was addressed initially.  Using a #15 blade this area was fully excised in an ellipse shape.  The excision was marked at the 12 o'clock position.  Then figure-of-eight sutures were used to close incision.  2-0 Vicryl was used.  The right inferior buttocks lesion was then elevated with pickups with teeth and excised with a #15 blade.  Silver nitrate was used for hemostasis on the site.  The right inferior buttocks lesion was also elevated and excised with Metzenbaum scissors.  2 single interrupted sutures of 2-0 Vicryl were used to close this incision.  In the right inferior vulvar lesion was biopsied.  I think this area is just pigmentation so full excision was not performed.  At this point the procedure was ended.  The prep was cleansed of the patient's skin. The legs are positioned back in the supine position. Sponge, lap, needle, instrument counts were correct x2. Patient was taken to recovery in stable condition.   COUNTS:  YES  PLAN OF CARE: Transfer to PACU

## 2021-11-23 NOTE — Anesthesia Postprocedure Evaluation (Signed)
Anesthesia Post Note  Patient: Sonya Aguilar  Procedure(s) Performed: WIDE LOCAL EXCISION OF VULVAR VIN (Vulva) VULVAR COLPOSCOPY (Vulva)     Patient location during evaluation: PACU Anesthesia Type: General Level of consciousness: awake Pain management: pain level controlled Vital Signs Assessment: post-procedure vital signs reviewed and stable Respiratory status: spontaneous breathing Cardiovascular status: stable Postop Assessment: no apparent nausea or vomiting Anesthetic complications: no   No notable events documented.  Last Vitals:  Vitals:   11/23/21 0910 11/23/21 0925  BP: 131/84 134/86  Pulse: (!) 58 62  Resp: 15 16  Temp:  (!) 36.3 C  SpO2: 94% 93%    Last Pain:  Vitals:   11/23/21 0925  TempSrc:   PainSc: 0-No pain                 Shellye Zandi

## 2021-11-24 ENCOUNTER — Encounter (HOSPITAL_COMMUNITY): Payer: Self-pay | Admitting: Obstetrics & Gynecology

## 2021-11-28 LAB — SURGICAL PATHOLOGY

## 2021-12-03 ENCOUNTER — Ambulatory Visit (INDEPENDENT_AMBULATORY_CARE_PROVIDER_SITE_OTHER): Payer: 59 | Admitting: Obstetrics & Gynecology

## 2021-12-03 DIAGNOSIS — N901 Moderate vulvar dysplasia: Secondary | ICD-10-CM

## 2021-12-03 DIAGNOSIS — D071 Carcinoma in situ of vulva: Secondary | ICD-10-CM

## 2021-12-03 MED ORDER — LIDOCAINE 5 % EX OINT: 1.0000 | TOPICAL_OINTMENT | Freq: Three times a day (TID) | CUTANEOUS | 0 refills | Status: DC | PRN

## 2021-12-07 ENCOUNTER — Encounter (HOSPITAL_BASED_OUTPATIENT_CLINIC_OR_DEPARTMENT_OTHER): Payer: Self-pay | Admitting: Obstetrics & Gynecology

## 2021-12-07 DIAGNOSIS — N901 Moderate vulvar dysplasia: Secondary | ICD-10-CM | POA: Insufficient documentation

## 2021-12-07 DIAGNOSIS — D071 Carcinoma in situ of vulva: Secondary | ICD-10-CM | POA: Insufficient documentation

## 2021-12-07 NOTE — Progress Notes (Signed)
GYNECOLOGY  VISIT  CC:   post op recheck  HPI: 51 y.o. G21P0011 Divorced Black or [place] American female here for recheck after undergoing excision of VIN 2/3 with additional biopsies/excisions of lesions with colposcopy of vulva on 11/22/2021.  She reports bleeding is minimal.  She has improved pain.  It has been difficult this past week but doing well.  Bowel function is Normal.  Bladder function is normal.    Pathology reviewed:  Yes .  Questions answer.    MEDS:   Current Outpatient Medications on File Prior to Visit  Medication Sig Dispense Refill   amLODipine (NORVASC) 5 MG tablet Take 1 tablet (5 mg total) by mouth daily. (Patient taking differently: Take 5 mg by mouth every other day.) 90 tablet 1   aspirin EC 81 MG tablet Take 81 mg by mouth daily. Swallow whole.     aspirin-acetaminophen-caffeine (EXCEDRIN MIGRAINE) 250-250-65 MG tablet Take 1 tablet by mouth every 6 (six) hours as needed for headache.     Cholecalciferol (VITAMIN D3 PO) Take 1 capsule by mouth daily.     Cyanocobalamin (B-12 PO) Take 1 tablet by mouth daily.     DM-APAP-CPM (CORICIDIN HBP PO) Take 1 tablet by mouth daily as needed (allergies).     gabapentin (NEURONTIN) 100 MG capsule Take 1 capsule nightly x 3 nights, then increase as needed to 2 capsules x 3 nights.  Then may increased to 3 capsules. (Patient not taking: Reported on 11/12/2021) 60 capsule 0   HYDROcodone-acetaminophen (NORCO/VICODIN) 5-325 MG tablet Take 1-2 tablets by mouth every 6 (six) hours as needed for moderate pain. 15 tablet 0   ibuprofen (ADVIL) 600 MG tablet Take 1 tablet (600 mg total) by mouth every 6 (six) hours as needed. 30 tablet 0   MAGNESIUM PO Take 1 capsule by mouth daily.     Multiple Vitamins-Minerals (ZINC PO) Take 1 tablet by mouth daily.     potassium chloride (KLOR-CON M) 10 MEQ tablet Take 1 tablet (10 mEq total) by mouth daily for 4 days. 4 tablet 0   TURMERIC CURCUMIN PO Take 1 capsule by mouth daily.     No current  facility-administered medications on file prior to visit.    SH:  Smoking No    PHYSICAL EXAMINATION:    LMP 07/11/2021 (Approximate)     General appearance: alert, cooperative and appears stated age Incisions:  C/D/I  Pelvic: External genitalia:  no lesions, areas healing well.  Larger incision I feel needs to heal a few more days before removing sutures.  Small incision with sutures healing well as well and suture removed without difficulty.              Chaperone, Octaviano Batty, was present for exam.  Assessment/Plan: 1. 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  Then will need vulvar colposcopy in 6 months.  Pt aware will need vulvar exams every 6 months for next 5 years.  Voices understanding.

## 2021-12-08 ENCOUNTER — Ambulatory Visit (INDEPENDENT_AMBULATORY_CARE_PROVIDER_SITE_OTHER): Payer: 59 | Admitting: Obstetrics & Gynecology

## 2021-12-08 ENCOUNTER — Encounter (HOSPITAL_BASED_OUTPATIENT_CLINIC_OR_DEPARTMENT_OTHER): Payer: Self-pay | Admitting: Obstetrics & Gynecology

## 2021-12-08 VITALS — BP 151/93 | HR 97 | Ht 67.0 in | Wt 184.0 lb

## 2021-12-08 DIAGNOSIS — N901 Moderate vulvar dysplasia: Secondary | ICD-10-CM

## 2021-12-08 DIAGNOSIS — D071 Carcinoma in situ of vulva: Secondary | ICD-10-CM

## 2021-12-11 NOTE — Progress Notes (Signed)
GYNECOLOGY  VISIT  CC:   post op recheck/suture removal  HPI: 51 y.o. G52P0011 Divorced Black or [place] American female here for suture removal.  Was seen last week for initial post op appt but I felt sutures with larger incision needed to stay in a few more days.  Pt is having itching at incision site.  Denies bleeding.   MEDS:   Current Outpatient Medications on File Prior to Visit  Medication Sig Dispense Refill   amLODipine (NORVASC) 5 MG tablet Take 1 tablet (5 mg total) by mouth daily. (Patient taking differently: Take 5 mg by mouth every other day.) 90 tablet 1   aspirin EC 81 MG tablet Take 81 mg by mouth daily. Swallow whole.     aspirin-acetaminophen-caffeine (EXCEDRIN MIGRAINE) 250-250-65 MG tablet Take 1 tablet by mouth every 6 (six) hours as needed for headache.     Cholecalciferol (VITAMIN D3 PO) Take 1 capsule by mouth daily.     Cyanocobalamin (B-12 PO) Take 1 tablet by mouth daily.     DM-APAP-CPM (CORICIDIN HBP PO) Take 1 tablet by mouth daily as needed (allergies).     gabapentin (NEURONTIN) 100 MG capsule Take 1 capsule nightly x 3 nights, then increase as needed to 2 capsules x 3 nights.  Then may increased to 3 capsules. (Patient not taking: Reported on 11/12/2021) 60 capsule 0   HYDROcodone-acetaminophen (NORCO/VICODIN) 5-325 MG tablet Take 1-2 tablets by mouth every 6 (six) hours as needed for moderate pain. 15 tablet 0   ibuprofen (ADVIL) 600 MG tablet Take 1 tablet (600 mg total) by mouth every 6 (six) hours as needed. 30 tablet 0   lidocaine (XYLOCAINE) 5 % ointment Apply 1 Application topically 3 (three) times daily as needed. 30 g 0   MAGNESIUM PO Take 1 capsule by mouth daily.     Multiple Vitamins-Minerals (ZINC PO) Take 1 tablet by mouth daily.     potassium chloride (KLOR-CON M) 10 MEQ tablet Take 1 tablet (10 mEq total) by mouth daily for 4 days. 4 tablet 0   TURMERIC CURCUMIN PO Take 1 capsule by mouth daily.     No current facility-administered medications  on file prior to visit.    SH:  Smoking No    PHYSICAL EXAMINATION:    BP (!) 151/93   Pulse 97   Ht '5\' 7"'$  (1.702 m)   Wt 184 lb (83.5 kg)   LMP 07/11/2021 (Approximate)   BMI 28.82 kg/m     General appearance: alert, cooperative and appears stated age  Pelvic: External genitalia:  incision healing well on left vulva, all sutures removed from incision, other sites are essentially healed.                 Chaperone, Ezekiel Ina, RN, was present for exam.  Assessment/Plan: 1. Vulvar intraepithelial neoplasia (VIN) grade 3 - will have repeat coloscopy due to single positive margin in 6 months.  This will be scheduled before she leaves today.

## 2021-12-14 ENCOUNTER — Telehealth: Payer: 59 | Admitting: Family

## 2021-12-14 ENCOUNTER — Telehealth: Payer: Self-pay | Admitting: Family

## 2021-12-14 NOTE — Telephone Encounter (Signed)
Patient was told ov may be necessary

## 2021-12-14 NOTE — Telephone Encounter (Signed)
I have called the pt and informed her that Mickel Baas is unable to see her until next week and if she wants immediate relief an urgent care or visit is needed. She stated understanding and is willing to see someone virtually. I have put her on the schedule for today as a VV at 6:20p. She stated understanding.

## 2021-12-14 NOTE — Telephone Encounter (Signed)
Patient called to advise she has a sinus infection every year around this time and she wants to know if she can get something called in for it. Patient uses [facility] on [address]. Please call patient to advise either way

## 2022-05-26 ENCOUNTER — Ambulatory Visit (INDEPENDENT_AMBULATORY_CARE_PROVIDER_SITE_OTHER): Payer: Self-pay | Admitting: Obstetrics & Gynecology

## 2022-05-26 ENCOUNTER — Encounter (HOSPITAL_BASED_OUTPATIENT_CLINIC_OR_DEPARTMENT_OTHER): Payer: Self-pay | Admitting: Obstetrics & Gynecology

## 2022-05-26 VITALS — BP 110/77 | HR 83 | Wt 175.6 lb

## 2022-05-26 DIAGNOSIS — Z1231 Encounter for screening mammogram for malignant neoplasm of breast: Secondary | ICD-10-CM

## 2022-05-26 DIAGNOSIS — D071 Carcinoma in situ of vulva: Secondary | ICD-10-CM

## 2022-05-26 DIAGNOSIS — N901 Moderate vulvar dysplasia: Secondary | ICD-10-CM

## 2022-05-29 NOTE — Progress Notes (Signed)
52 y.o. G63P0011 Divorced Not Hispanic or Latino female here for vulvar colposcopy with possible biopsies.   Pt has hx of vulvar lesion that measured 4.25cm.  Positive margin was present at 12 o'clock.  Recommended repeat  colposcopy every six months.     Unrelated, pt needs MMG and order placed.     Patient's last menstrual period was 07/11/2021 (approximate).          Sexually active: No.  The current method of family planning is post menopausal status.     Patient has been counseled about results and procedure.  Risks and benefits have bene reviewed including immediate and/or delayed bleeding, infection, cervical scaring from procedure, possibility of needing additional follow up as well as treatment.  Rare risks of missing a lesion discussed as well.  All questions answered.  Pt ready to proceed.  Consent obtained.  BP 110/77 (BP Location: Right Arm, Patient Position: Sitting, Cuff Size: Normal)   Pulse 83   Wt 175 lb 9.6 oz (79.7 kg)   LMP 07/11/2021 (Approximate)   BMI 27.50 kg/m   General appearance: alert, cooperative and appears stated age Lymph nodes: No abnormal inguinal nodes palpated Neurologic: Grossly normal  Pelvic: External genitalia:  no lesions              Urethra:  normal appearing urethra with no masses, tenderness or lesions              Bartholins and Skenes: normal                 Vagina: normal appearing vagina with normal color and no discharge, no lesions               Physical Exam Constitutional:      Appearance: Normal appearance.  Genitourinary:   Skin:    General: Skin is warm.  Neurological:     General: No focal deficit present.     Mental Status: She is alert.    Speculum placed.  3% acetic acid applied to vulvar for more than 3 minutes.  Using the colposcope, the vulva was inspected in a clockwise fashion.  7.5X magnification was used.  Prior incision was clearly seen and this was inspected as well.  No abnormal findings noted.  No biopsies  was obtained.  Pt tolerated procedure well.  Findings noted on images above.     Assessment/Plan: 1. Vulvar intraepithelial neoplasia (VIN) grade 3 - recheck 6 months  2. Vulvar intraepithelial neoplasia (VIN) grade 2  3. Encounter for screening mammogram for malignant neoplasm of breast - MM 3D SCREENING MAMMOGRAM BILATERAL BREAST; Future

## 2022-05-31 ENCOUNTER — Encounter (HOSPITAL_BASED_OUTPATIENT_CLINIC_OR_DEPARTMENT_OTHER): Payer: Self-pay | Admitting: Obstetrics & Gynecology

## 2022-06-02 ENCOUNTER — Other Ambulatory Visit (HOSPITAL_COMMUNITY)
Admission: RE | Admit: 2022-06-02 | Discharge: 2022-06-02 | Disposition: A | Discharge status: Home / Self Care | Payer: Self-pay | Source: Ambulatory Visit | Attending: Obstetrics & Gynecology | Admitting: Obstetrics & Gynecology

## 2022-06-02 ENCOUNTER — Encounter (HOSPITAL_BASED_OUTPATIENT_CLINIC_OR_DEPARTMENT_OTHER): Payer: Self-pay | Admitting: Obstetrics & Gynecology

## 2022-06-02 ENCOUNTER — Ambulatory Visit (INDEPENDENT_AMBULATORY_CARE_PROVIDER_SITE_OTHER): Payer: Self-pay | Admitting: Obstetrics & Gynecology

## 2022-06-02 VITALS — BP 145/70 | HR 70 | Wt 179.4 lb

## 2022-06-02 DIAGNOSIS — N950 Postmenopausal bleeding: Secondary | ICD-10-CM

## 2022-06-02 NOTE — Progress Notes (Signed)
GYNECOLOGY  VISIT  CC:   PMP bleeding  HPI: 52 y.o. G40P0011 Divorced Black or [place] American female here for complaint of PMP bleeding that started Monday.  This was bright red and then became [place].  She had some pelvic fullness before the bleeding started.  There was some cramping as well.  She has also had breast fullness and tenderness.   Pap on 09/01/2021 was normal.   Past Medical History:  Diagnosis Date   Heart murmur    years ago   Hypertension    Scoliosis     MEDS:   Current Outpatient Medications on File Prior to Visit  Medication Sig Dispense Refill   amLODipine (NORVASC) 5 MG tablet Take 1 tablet (5 mg total) by mouth daily. (Patient taking differently: Take 5 mg by mouth every other day.) 90 tablet 1   aspirin-acetaminophen-caffeine (EXCEDRIN MIGRAINE) 250-250-65 MG tablet Take 1 tablet by mouth every 6 (six) hours as needed for headache.     Cholecalciferol (VITAMIN D3 PO) Take 1 capsule by mouth daily.     Cyanocobalamin (B-12 PO) Take 1 tablet by mouth daily.     DM-APAP-CPM (CORICIDIN HBP PO) Take 1 tablet by mouth daily as needed (allergies).     ibuprofen (ADVIL) 600 MG tablet Take 1 tablet (600 mg total) by mouth every 6 (six) hours as needed. 30 tablet 0   lidocaine (XYLOCAINE) 5 % ointment Apply 1 Application topically 3 (three) times daily as needed. 30 g 0   MAGNESIUM PO Take 1 capsule by mouth daily.     Multiple Vitamins-Minerals (ZINC PO) Take 1 tablet by mouth daily.     TURMERIC CURCUMIN PO Take 1 capsule by mouth daily.     aspirin EC 81 MG tablet Take 81 mg by mouth daily. Swallow whole. (Patient not taking: Reported on 06/02/2022)     potassium chloride (KLOR-CON M) 10 MEQ tablet Take 1 tablet (10 mEq total) by mouth daily for 4 days. 4 tablet 0   No current facility-administered medications on file prior to visit.    ALLERGIES: Egg-derived products and Oxycodone  SH:  divorced, non smoker  Review of Systems  Constitutional: Negative.    Genitourinary: Negative.     PHYSICAL EXAMINATION:    BP (!) 145/70 (BP Location: Right Arm, Patient Position: Sitting, Cuff Size: Large)   Pulse 70   Wt 179 lb 6.4 oz (81.4 kg)   LMP 07/11/2021 (Approximate)   BMI 28.10 kg/m     General appearance: alert, cooperative and appears stated age Lymph:  no inguinal LAD noted  Pelvic: External genitalia:  no lesions              Urethra:  normal appearing urethra with no masses, tenderness or lesions              Bartholins and Skenes: normal                 Vagina: normal appearing vagina with normal color and discharge, no lesions              Cervix: no lesions and dark blood present at os              Bimanual Exam:  Uterus:  normal size, contour, position, consistency, mobility, non-tender              Adnexa: no mass, fullness, tenderness               Endometrial biopsy recommended.  Discussed with patient.  Verbal and written consent obtained.   Procedure:  Speculum placed.  Cervix visualized and cleansed with betadine prep.  A single toothed tenaculum was applied to the anterior lip of the cervix.  Endometrial pipelle was advanced through the cervix into the endometrial cavity without difficulty.  Pipelle passed to 7.5cm.  Suction applied and pipelle removed with good tissue sample obtained.  Tenculum removed.  No bleeding noted.  Patient tolerated procedure well.  Chaperone, Ina Homes, CMA, was present for exam.  Assessment/Plan: 1. Postmenopausal bleeding - Surgical pathology( [place]/ [facility])

## 2022-06-06 LAB — SURGICAL PATHOLOGY

## 2022-06-10 ENCOUNTER — Ambulatory Visit (HOSPITAL_BASED_OUTPATIENT_CLINIC_OR_DEPARTMENT_OTHER)
Admission: RE | Admit: 2022-06-10 | Discharge: 2022-06-10 | Disposition: A | Discharge status: Home / Self Care | Payer: Self-pay | Source: Ambulatory Visit | Attending: Obstetrics & Gynecology | Admitting: Obstetrics & Gynecology

## 2022-06-10 DIAGNOSIS — Z1231 Encounter for screening mammogram for malignant neoplasm of breast: Secondary | ICD-10-CM | POA: Insufficient documentation

## 2022-07-01 ENCOUNTER — Encounter: Payer: Self-pay | Admitting: Family

## 2022-07-01 ENCOUNTER — Ambulatory Visit: Payer: 59 | Admitting: Family

## 2022-07-01 VITALS — BP 122/70 | HR 73 | Ht 67.0 in | Wt 176.2 lb

## 2022-07-01 DIAGNOSIS — R109 Unspecified abdominal pain: Secondary | ICD-10-CM | POA: Diagnosis not present

## 2022-07-01 DIAGNOSIS — R202 Paresthesia of skin: Secondary | ICD-10-CM

## 2022-07-01 DIAGNOSIS — I10 Essential (primary) hypertension: Secondary | ICD-10-CM

## 2022-07-01 DIAGNOSIS — Z13220 Encounter for screening for lipoid disorders: Secondary | ICD-10-CM | POA: Diagnosis not present

## 2022-07-01 DIAGNOSIS — R634 Abnormal weight loss: Secondary | ICD-10-CM

## 2022-07-01 DIAGNOSIS — R739 Hyperglycemia, unspecified: Secondary | ICD-10-CM

## 2022-07-01 NOTE — Progress Notes (Signed)
Sonya Aguilar is a 52 y.o. 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 (Patient taking differently: Take 5 mg by mouth every other day.) 90 tablet 1   aspirin EC 81 MG tablet Take 81 mg by mouth daily. Swallow whole.     aspirin-acetaminophen-caffeine (EXCEDRIN MIGRAINE) 250-250-65 MG tablet Take 1 tablet by mouth every 6 (six) hours as needed for headache.     Cholecalciferol (VITAMIN D3 PO) Take 1 capsule by mouth daily.     Cyanocobalamin (B-12 PO) Take 1 tablet by mouth daily.     DM-APAP-CPM (CORICIDIN HBP PO) Take 1 tablet by mouth daily as needed (allergies).     ibuprofen (ADVIL) 600 MG tablet Take 1 tablet (600 mg total) by mouth every 6 (six) hours as needed. 30 tablet 0   lidocaine (XYLOCAINE) 5 % ointment Apply 1 Application topically 3 (three) times daily as needed. 30 g 0   MAGNESIUM PO Take 1 capsule by mouth daily.     Multiple Vitamins-Minerals (ZINC PO) Take 1 tablet by mouth daily.     TURMERIC CURCUMIN PO Take 1 capsule by mouth daily.     potassium chloride (KLOR-CON M) 10 MEQ tablet Take 1 tablet (10 mEq total) by mouth daily for 4 days. 4 tablet 0   No current facility-administered medications for this visit.    Allergies: Egg-derived products and Oxycodone  Past  Medical History:  Diagnosis Date   Heart murmur    years ago   Hypertension    Scoliosis     Past Surgical History:  Procedure Laterality Date   CESAREAN SECTION     CESAREAN SECTION     2000   COLPOSCOPY N/A 11/23/2021   Procedure: VULVAR COLPOSCOPY;  Surgeon: Jerene Bears, MD;  Location: [facility] OR;  Service: Gynecology;  Laterality: N/A;   DILATION AND CURETTAGE OF UTERUS     miscarriage     2012   VULVA /PERINEUM BIOPSY N/A 11/23/2021   Procedure: WIDE LOCAL EXCISION OF VULVAR VIN;  Surgeon: Jerene Bears, MD;  Location: [facility] OR;  Service: Gynecology;  Laterality: N/A;    Family History  Problem Relation Age of Onset   Hypertension Father    Diabetes Father    Cancer Father        prostate   Diabetes Mother    Hypertension Mother     Social History   Tobacco Use   Smoking status: Never   Smokeless tobacco: Never  Substance Use Topics   Alcohol use: No    Alcohol/week: 0.0 standard drinks of alcohol    Subjective:   Presents for follow up on chronic care needs- would like to get labs updated to see how everything is; Admits not taking her blood pressure daily as prescribed- only taking when her pressure gets high; Has been  having increased indigestion/ feeling bloated;    Objective:  Vitals:   07/01/22 1549  BP: 122/70  Pulse: 73  SpO2: 99%  Weight: 176 lb 3.2 oz (79.9 kg)  Height: 5\' 7"  (1.702 m)    General: Well developed, well nourished, in no acute distress  Skin : Warm and dry.  Head: Normocephalic and atraumatic  Eyes: Sclera and conjunctiva clear; pupils round and reactive to light; extraocular movements intact  Ears: External normal; canals clear; tympanic membranes normal  Oropharynx: Pink, supple. No suspicious lesions  Neck: Supple without thyromegaly, adenopathy  Lungs: Respirations unlabored; clear to auscultation bilaterally without wheeze, rales, rhonchi  CVS exam: normal rate and regular rhythm.  Abdomen: Soft; nontender; nondistended;  normoactive bowel sounds; no masses or hepatosplenomegaly  Musculoskeletal: No deformities; no active joint inflammation  Extremities: No edema, cyanosis, clubbing  Vessels: Symmetric bilaterally  Neurologic: Alert and oriented; speech intact; face symmetrical; moves all extremities well; CNII-XII intact without focal deficit   Assessment:  1. Elevated blood sugar   2. Lipid screening   3. Paresthesia   4. Weight loss   5. Abdominal pain, unspecified abdominal location   6. Primary hypertension     Plan:  Check CMP, Hgba1c; Check lipid panel; Check B12 level; Check TSH; Patient defers medication for reflux- update abdominal ultrasound; Follow up to be determined;  Did encourage her to take her blood pressure medication as prescribed;   No follow-ups on file.  Orders Placed This Encounter  Procedures   US Abdomen Complete    Standing Status:   Future    Standing Expiration Date:   07/01/2023    Order Specific Question:   Reason for Exam (SYMPTOM  OR DIAGNOSIS REQUIRED)    Answer:   abdominal pain    Order Specific Question:   Preferred imaging location?    Answer:   [facility]   CBC with Differential/Platelet   Comp Met (CMET)   Lipid panel   Hemoglobin A1c   B12   TSH    Requested Prescriptions    No prescriptions requested or ordered in this encounter

## 2022-07-02 LAB — COMPREHENSIVE METABOLIC PANEL
AG Ratio: 1.3 (calc) (ref 1.0–2.5)
ALT: 8 U/L (ref 6–29)
AST: 10 U/L (ref 10–35)
Albumin: 4.3 g/dL (ref 3.6–5.1)
Alkaline phosphatase (APISO): 102 U/L (ref 37–153)
BUN: 10 mg/dL (ref 7–25)
CO2: 25 mmol/L (ref 20–32)
Calcium: 8.9 mg/dL (ref 8.6–10.4)
Chloride: 104 mmol/L (ref 98–110)
Creat: 0.65 mg/dL (ref 0.50–1.03)
Globulin: 3.4 g/dL (calc) (ref 1.9–3.7)
Glucose, Bld: 87 mg/dL (ref 65–99)
Potassium: 3.9 mmol/L (ref 3.5–5.3)
Sodium: 140 mmol/L (ref 135–146)
Total Bilirubin: 0.4 mg/dL (ref 0.2–1.2)
Total Protein: 7.7 g/dL (ref 6.1–8.1)

## 2022-07-02 LAB — TSH: TSH: 0.53 mIU/L

## 2022-07-02 LAB — HEMOGLOBIN A1C
Hgb A1c MFr Bld: 6.2 % of total Hgb — ABNORMAL HIGH (ref <5.7)
Mean Plasma Glucose: 131 mg/dL
eAG (mmol/L): 7.3 mmol/L

## 2022-07-02 LAB — CBC WITH DIFFERENTIAL/PLATELET
Absolute Monocytes: 360 cells/uL (ref 200–950)
Basophils Absolute: 32 cells/uL (ref 0–200)
Basophils Relative: 0.7 %
Eosinophils Absolute: 72 cells/uL (ref 15–500)
Eosinophils Relative: 1.6 %
HCT: 36.6 % (ref 35.0–45.0)
Hemoglobin: 11.6 g/dL — ABNORMAL LOW (ref 11.7–15.5)
Lymphs Abs: 1886 cells/uL (ref 850–3900)
MCH: 25.9 pg — ABNORMAL LOW (ref 27.0–33.0)
MCHC: 31.7 g/dL — ABNORMAL LOW (ref 32.0–36.0)
MCV: 81.7 fL (ref 80.0–100.0)
MPV: 11 fL (ref 7.5–12.5)
Monocytes Relative: 8 %
Neutro Abs: 2151 cells/uL (ref 1500–7800)
Neutrophils Relative %: 47.8 %
Platelets: 270 10*3/uL (ref 140–400)
RBC: 4.48 10*6/uL (ref 3.80–5.10)
RDW: 14.1 % (ref 11.0–15.0)
Total Lymphocyte: 41.9 %
WBC: 4.5 10*3/uL (ref 3.8–10.8)

## 2022-07-02 LAB — LIPID PANEL
Cholesterol: 201 mg/dL — ABNORMAL HIGH (ref <200)
HDL: 62 mg/dL (ref >=50)
LDL Cholesterol (Calc): 123 mg/dL (calc) — ABNORMAL HIGH
Non-HDL Cholesterol (Calc): 139 mg/dL (calc) — ABNORMAL HIGH (ref <130)
Total CHOL/HDL Ratio: 3.2 (calc) (ref <5.0)
Triglycerides: 68 mg/dL (ref <150)

## 2022-07-02 LAB — VITAMIN B12: Vitamin B-12: 645 pg/mL (ref 200–1100)

## 2022-07-06 ENCOUNTER — Encounter: Payer: Self-pay | Admitting: Family

## 2022-07-20 ENCOUNTER — Telehealth: Payer: Self-pay | Admitting: Family

## 2022-07-20 NOTE — Telephone Encounter (Signed)
FYI: This call has been transferred to triage nurse: the Triage Nurse. Once the result note has been entered staff can address the message at that time.  Patient called in with the following symptoms:  Red Word:chest pain, dizziness , and headache   Please advise at Mobile [phone] (mobile)  Message is routed to Provider Pool.

## 2022-07-20 NOTE — Telephone Encounter (Signed)
Initial Comment Caller states she has chest pains on the left side for the last month. Translation No Nurse Assessment Nurse: Clarita Leber, RN, Deborah Date/Time (Eastern Time): 07/20/2022 3:53:45 PM Confirm and document reason for call. If symptomatic, describe symptoms. ---Caller states that she has been having left chest pain for the past month on and off. At the top of her left breast. Sharp pain and feels like it travels at times. Stabbing pain. She has been under a lot of stress. Has an uncle who is terminal. Does the patient have any new or worsening symptoms? ---Yes Will a triage be completed? ---Yes Related visit to physician within the last 2 weeks? ---No Does the PT have any chronic conditions? (i.e. diabetes, asthma, this includes High risk factors for pregnancy, etc.) ---Yes List chronic conditions. ---On amlodipine for hypertension, HgA1C was 6.2 Is the patient pregnant or possibly pregnant? (Ask all females between the ages of 68-55) ---No Is this a behavioral health or substance abuse call? ---No Guidelines Guideline Title Affirmed Question Affirmed Notes Nurse Date/Time (Eastern Time) Chest Pain [1] Chest pain(s) lasting a few seconds from coughing AND [2] persists > 3 days Clarita Leber, RN, Gavin Pound 07/20/2022 3:56:53 PM PLEASE NOTE: All timestamps contained within this report are represented as [place] Standard Time. CONFIDENTIALTY NOTICE: This fax transmission is intended only for the addressee. It contains information that is legally privileged, confidential or otherwise protected from use or disclosure. If you are not the intended recipient, you are strictly prohibited from reviewing, disclosing, copying using or disseminating any of this information or taking any action in reliance on or regarding this information. If you have received this fax in error, please notify us immediately by telephone so that we can arrange for its return to [place]. Phone: [phone],  Toll-Free: [phone], Fax: [phone] Page: 2 of 2 Call Id: [id number] Disp. Time Lamount Cohen Time) Disposition Final User 07/20/2022 3:50:47 PM Send to Urgent Queue Billey Chang 07/20/2022 4:11:54 PM SEE PCP WITHIN 3 DAYS Yes Clarita Leber, RN, Deborah Final Disposition 07/20/2022 4:11:54 PM SEE PCP WITHIN 3 DAYS Yes Womble, RN, Jetty Duhamel Disagree/Comply Comply Caller Understands Yes PreDisposition Call Doctor Care Advice Given Per Guideline SEE PCP WITHIN 3 DAYS: CALL BACK IF: * Chest pain increases in frequency, duration or severity * Chest pain lasts over 5 minutes Comments User: Alita Chyle, RN Date/Time Lamount Cohen Time): 07/20/2022 4:13:22 PM Warm transferred the caller for appt this week. She was instructed to call back in the interim if her symptoms length in time or she worsens or if she develops new symptoms and she verbalized understanding

## 2022-07-20 NOTE — Telephone Encounter (Signed)
Lm PT.  Triage nurse asked for pt to be scheduled "sometime this week" .  Pt transferred via Triage: chest pain. When asking pt more questions Pt said she did not want to answer anymore questions because she was tired and it had been a very long day.   She is aware to go to UC/ER immediately should her sxs change worsen in the mean time.   Pt has been scheduled Friday at 11:40am.

## 2022-07-21 NOTE — Telephone Encounter (Signed)
Pt scheduled for Friday

## 2022-07-22 ENCOUNTER — Ambulatory Visit: Payer: 59 | Admitting: Family Medicine

## 2022-09-25 DIAGNOSIS — S6992XA Unspecified injury of left wrist, hand and finger(s), initial encounter: Secondary | ICD-10-CM | POA: Diagnosis not present

## 2022-09-25 DIAGNOSIS — Y9259 Other trade areas as the place of occurrence of the external cause: Secondary | ICD-10-CM | POA: Diagnosis not present

## 2022-09-25 DIAGNOSIS — Z043 Encounter for examination and observation following other accident: Secondary | ICD-10-CM | POA: Diagnosis not present

## 2022-09-25 DIAGNOSIS — S61213A Laceration without foreign body of left middle finger without damage to nail, initial encounter: Secondary | ICD-10-CM | POA: Diagnosis not present

## 2022-09-25 DIAGNOSIS — Y999 Unspecified external cause status: Secondary | ICD-10-CM | POA: Diagnosis not present

## 2022-09-25 DIAGNOSIS — W230XXA Caught, crushed, jammed, or pinched between moving objects, initial encounter: Secondary | ICD-10-CM | POA: Diagnosis not present

## 2022-09-30 ENCOUNTER — Encounter: Payer: Self-pay | Admitting: Family

## 2022-09-30 ENCOUNTER — Ambulatory Visit: Payer: 59 | Admitting: Family

## 2022-09-30 VITALS — BP 142/86 | HR 80 | Temp 98.8°F | Resp 19 | Ht 67.0 in | Wt 180.0 lb

## 2022-09-30 DIAGNOSIS — S61213D Laceration without foreign body of left middle finger without damage to nail, subsequent encounter: Secondary | ICD-10-CM | POA: Diagnosis not present

## 2022-09-30 DIAGNOSIS — J019 Acute sinusitis, unspecified: Secondary | ICD-10-CM

## 2022-09-30 MED ORDER — DOXYCYCLINE HYCLATE 100 MG PO TABS: 100.0000 mg | ORAL_TABLET | Freq: Two times a day (BID) | ORAL | 0 refills | Status: DC

## 2022-09-30 MED ORDER — AZELASTINE HCL 0.05 % OP SOLN: 1.0000 [drp] | Freq: Two times a day (BID) | OPHTHALMIC | 1 refills | Status: AC

## 2022-09-30 NOTE — Progress Notes (Signed)
Sonya Aguilar is a 52 y.o. 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 (Patient taking differently: Take 5 mg by mouth every other day.) 90 tablet 1   aspirin EC 81 MG tablet Take 81 mg by mouth daily. Swallow whole.     aspirin-acetaminophen-caffeine (EXCEDRIN MIGRAINE) 250-250-65 MG tablet Take 1 tablet by mouth every 6 (six) hours as needed for headache.     azelastine (OPTIVAR) 0.05 % ophthalmic solution Place 1 drop into both eyes 2 (two) times daily. 6 mL 1   Cholecalciferol (VITAMIN D3 PO) Take 1 capsule by mouth daily.     Cyanocobalamin (B-12 PO) Take 1 tablet by mouth daily.     DM-APAP-CPM (CORICIDIN HBP PO) Take 1 tablet by mouth daily as needed (allergies).     doxycycline (VIBRA-TABS) 100 MG tablet Take 1 tablet (100 mg total) by mouth 2 (two) times daily. 14 tablet 0   ibuprofen (ADVIL) 600 MG tablet Take 1 tablet (600 mg total) by mouth every 6 (six) hours as needed. 30 tablet 0   lidocaine (XYLOCAINE) 5 % ointment Apply 1 Application topically 3 (three) times daily as needed. 30 g 0   MAGNESIUM PO Take 1 capsule by mouth daily.     Multiple Vitamins-Minerals (ZINC PO) Take 1 tablet by mouth daily.     TURMERIC CURCUMIN PO Take 1 capsule by mouth daily.     potassium chloride  (KLOR-CON M) 10 MEQ tablet Take 1 tablet (10 mEq total) by mouth daily for 4 days. 4 tablet 0   No current facility-administered medications for this visit.    Allergies: Egg-derived products and Oxycodone  Past Medical History:  Diagnosis Date   Heart murmur    years ago   Hypertension    Scoliosis     Past Surgical History:  Procedure Laterality Date   CESAREAN SECTION     CESAREAN SECTION     2000   COLPOSCOPY N/A 11/23/2021   Procedure: VULVAR COLPOSCOPY;  Surgeon: Jerene Bears, MD;  [facility] OR;  Service: Gynecology;  Laterality: N/A;   DILATION AND CURETTAGE OF UTERUS     miscarriage     2012   VULVA /PERINEUM BIOPSY N/A 11/23/2021   Procedure: WIDE LOCAL EXCISION OF VULVAR VIN;  Surgeon: Jerene Bears, MD;  Location: [facility] OR;  Service: Gynecology;  Laterality: N/A;    Family History  Problem Relation Age of Onset   Hypertension Father    Diabetes Father    Cancer Father        prostate   Diabetes Mother    Hypertension Mother     Social History   Tobacco Use   Smoking status: Never   Smokeless tobacco: Never  Substance Use Topics   Alcohol use: No    Alcohol/week: 0.0 standard drinks of alcohol  Subjective:   Patient injured her finger on 09/25/22- injured finger in car door; did go to ER for treatment; wants to make sure healing appropriately;  Cold symptoms worsening x 3 days; started last week after returning from [place]; facial swelling/ eyes itching;    Objective:  Vitals:   09/30/22 1350  BP: (!) 142/86  Pulse: 80  Resp: 19  Temp: 98.8 F (37.1 C)  TempSrc: Oral  SpO2: 93%  Weight: 180 lb (81.6 kg)  Height: 5\' 7"  (1.702 m)    General: Well developed, well nourished, in no acute distress  Skin : Warm and dry. Wound healing on 3rd finger with no signs of infection Head: Normocephalic and atraumatic  Eyes: Sclera and conjunctiva clear; pupils round and reactive to light; extraocular movements intact  Ears: External normal; canals  clear; tympanic membranes normal  Oropharynx: Pink, supple. No suspicious lesions  Neck: Supple without thyromegaly, adenopathy  Lungs: Respirations unlabored; clear to auscultation bilaterally without wheeze, rales, rhonchi  CVS exam: normal rate and regular rhythm.  Abdomen: Soft; nontender; nondistended; normoactive bowel sounds; no masses or hepatosplenomegaly  Musculoskeletal: No deformities; no active joint inflammation  Extremities: No edema, cyanosis, clubbing  Vessels: Symmetric bilaterally  Neurologic: Alert and oriented; speech intact; face symmetrical; moves all extremities well; CNII-XII intact without focal deficit   Assessment:  1. Acute sinusitis, recurrence not specified, unspecified location   2. Laceration of left middle finger without foreign body, nail damage status unspecified, subsequent encounter     Plan:  Suspect allergic component; Rx for Doxycycline and Optivar; follow up worse, no better; No infection noted- encouraged to use Triple antibiotic ointment and stop using Hydrogen Peroxide; continue to monitor; patient defers Tdap today;   No follow-ups on file.  No orders of the defined types were placed in this encounter.   Requested Prescriptions   Signed Prescriptions Disp Refills   azelastine (OPTIVAR) 0.05 % ophthalmic solution 6 mL 1    Sig: Place 1 drop into both eyes 2 (two) times daily.   doxycycline (VIBRA-TABS) 100 MG tablet 14 tablet 0    Sig: Take 1 tablet (100 mg total) by mouth 2 (two) times daily.

## 2022-10-22 ENCOUNTER — Other Ambulatory Visit: Payer: Self-pay | Admitting: Family

## 2022-10-26 ENCOUNTER — Other Ambulatory Visit: Payer: Self-pay | Admitting: Family

## 2022-11-29 ENCOUNTER — Encounter (HOSPITAL_BASED_OUTPATIENT_CLINIC_OR_DEPARTMENT_OTHER): Payer: Self-pay | Admitting: Obstetrics & Gynecology

## 2022-12-08 ENCOUNTER — Encounter (HOSPITAL_BASED_OUTPATIENT_CLINIC_OR_DEPARTMENT_OTHER): Payer: Self-pay | Admitting: Obstetrics & Gynecology

## 2022-12-08 ENCOUNTER — Ambulatory Visit (INDEPENDENT_AMBULATORY_CARE_PROVIDER_SITE_OTHER): Payer: Self-pay | Admitting: Obstetrics & Gynecology

## 2022-12-08 ENCOUNTER — Other Ambulatory Visit (HOSPITAL_COMMUNITY)
Admission: RE | Admit: 2022-12-08 | Discharge: 2022-12-08 | Disposition: A | Discharge status: Home / Self Care | Payer: 59 | Source: Ambulatory Visit | Attending: Obstetrics & Gynecology | Admitting: Obstetrics & Gynecology

## 2022-12-08 VITALS — BP 143/95 | HR 70 | Ht 67.0 in | Wt 185.2 lb

## 2022-12-08 DIAGNOSIS — Z01419 Encounter for gynecological examination (general) (routine) without abnormal findings: Secondary | ICD-10-CM | POA: Diagnosis not present

## 2022-12-08 DIAGNOSIS — Z124 Encounter for screening for malignant neoplasm of cervix: Secondary | ICD-10-CM | POA: Insufficient documentation

## 2022-12-08 DIAGNOSIS — Z87412 Personal history of vulvar dysplasia: Secondary | ICD-10-CM

## 2022-12-08 DIAGNOSIS — B977 Papillomavirus as the cause of diseases classified elsewhere: Secondary | ICD-10-CM | POA: Insufficient documentation

## 2022-12-08 DIAGNOSIS — N951 Menopausal and female climacteric states: Secondary | ICD-10-CM

## 2022-12-08 DIAGNOSIS — N901 Moderate vulvar dysplasia: Secondary | ICD-10-CM

## 2022-12-08 DIAGNOSIS — N9089 Other specified noninflammatory disorders of vulva and perineum: Secondary | ICD-10-CM

## 2022-12-08 MED ORDER — FLUCONAZOLE 150 MG PO TABS
150.0000 mg | ORAL_TABLET | Freq: Once | ORAL | 0 refills | Status: AC
Start: 2022-12-08 — End: 2022-12-08

## 2022-12-08 MED ORDER — TRIAMCINOLONE ACETONIDE 0.5 % EX OINT
TOPICAL_OINTMENT | CUTANEOUS | 0 refills | Status: AC
Start: 2022-12-08 — End: ?

## 2022-12-08 NOTE — Progress Notes (Deleted)
52 y.o. G29P0011 Divorced Black or [place] American female here for annual exam.    Patient's last menstrual period was 07/11/2021 (approximate).          Sexually active: {yes no:314532}  The current method of family planning is {contraception:315051}.     The pregnancy intention screening data noted above was reviewed. Potential methods of contraception were discussed. The patient elected to proceed with No data recorded.  Exercising: {yes no:314532}  {types:19826} Smoker:  {YES NO:22349}  Health Maintenance: Pap:  *** History of abnormal Pap:  {YES NO:22349} MMG:  *** Colonoscopy:  *** BMD:   *** Screening Labs: ***   reports that she has never smoked. She has never used smokeless tobacco. She reports that she does not drink alcohol and does not use drugs.  Past Medical History:  Diagnosis Date   Heart murmur    years ago   Hypertension    Scoliosis     Past Surgical History:  Procedure Laterality Date   CESAREAN SECTION     CESAREAN SECTION     2000   COLPOSCOPY N/A 11/23/2021   Procedure: VULVAR COLPOSCOPY;  Surgeon: Jerene Bears, MD;  Location: [facility] OR;  Service: Gynecology;  Laterality: N/A;   DILATION AND CURETTAGE OF UTERUS     miscarriage     2012   VULVA /PERINEUM BIOPSY N/A 11/23/2021   Procedure: WIDE LOCAL EXCISION OF VULVAR VIN;  Surgeon: Jerene Bears, MD;  Location: [facility] OR;  Service: Gynecology;  Laterality: N/A;    Current Outpatient Medications  Medication Sig Dispense Refill   amLODipine (NORVASC) 5 MG tablet Take 1 tablet (5 mg total) by mouth daily. 90 tablet 1   aspirin EC 81 MG tablet Take 81 mg by mouth daily. Swallow whole.     aspirin-acetaminophen-caffeine (EXCEDRIN MIGRAINE) 250-250-65 MG tablet Take 1 tablet by mouth every 6 (six) hours as needed for headache.     azelastine (OPTIVAR) 0.05 % ophthalmic solution Place 1 drop into both eyes 2 (two) times daily. 6 mL 1   Cholecalciferol (VITAMIN D3 PO) Take 1 capsule by mouth daily.      Cyanocobalamin (B-12 PO) Take 1 tablet by mouth daily.     DM-APAP-CPM (CORICIDIN HBP PO) Take 1 tablet by mouth daily as needed (allergies).     doxycycline (VIBRA-TABS) 100 MG tablet Take 1 tablet (100 mg total) by mouth 2 (two) times daily. 14 tablet 0   ibuprofen (ADVIL) 600 MG tablet Take 1 tablet (600 mg total) by mouth every 6 (six) hours as needed. 30 tablet 0   lidocaine (XYLOCAINE) 5 % ointment Apply 1 Application topically 3 (three) times daily as needed. 30 g 0   MAGNESIUM PO Take 1 capsule by mouth daily.     Multiple Vitamins-Minerals (ZINC PO) Take 1 tablet by mouth daily.     TURMERIC CURCUMIN PO Take 1 capsule by mouth daily.     potassium chloride (KLOR-CON M) 10 MEQ tablet Take 1 tablet (10 mEq total) by mouth daily for 4 days. 4 tablet 0   No current facility-administered medications for this visit.    Family History  Problem Relation Age of Onset   Hypertension Father    Diabetes Father    Cancer Father        prostate   Diabetes Mother    Hypertension Mother     ROS: Constitutional: {Findings; ROS constitutional:30497::"negative"} Genitourinary:{Findings; ROS genitourinary:19593::"negative"}  Exam:   BP (!) 143/95 (BP Location: Right Arm, Patient  Position: Sitting, Cuff Size: Normal)   Pulse 70   Ht 5\' 7"  (1.702 m)   Wt 185 lb 3.2 oz (84 kg)   LMP 07/11/2021 (Approximate)   BMI 29.01 kg/m   Height: 5\' 7"  (170.2 cm)  General appearance: alert, cooperative and appears stated age Head: Normocephalic, without obvious abnormality, atraumatic Neck: no adenopathy, supple, symmetrical, trachea midline and thyroid {EXAM; THYROID:18604} Lungs: clear to auscultation bilaterally Breasts: {Exam; breast:13139::"normal appearance, no masses or tenderness"} Heart: regular rate and rhythm Abdomen: soft, non-tender; bowel sounds normal; no masses,  no organomegaly Extremities: extremities normal, atraumatic, no cyanosis or edema Skin: Skin color, texture, turgor  normal. No rashes or lesions Lymph nodes: Cervical, supraclavicular, and axillary nodes normal. No abnormal inguinal nodes palpated Neurologic: Grossly normal   Pelvic: External genitalia:  no lesions              Urethra:  normal appearing urethra with no masses, tenderness or lesions              Bartholins and Skenes: normal                 Vagina: normal appearing vagina with normal color and no discharge, no lesions              Cervix: {exam; cervix:14595}              Pap taken: {yes no:314532} Bimanual Exam:  Uterus:  {exam; uterus:12215}              Adnexa: {exam; adnexa:12223}               Rectovaginal: Confirms               Anus:  normal sphincter tone, no lesions  Chaperone, ***, CMA, was present for exam.  Assessment/Plan:

## 2022-12-08 NOTE — Progress Notes (Unsigned)
52 y.o. G5P0011 Divorced Black or [place] American female here for annual exam.  Lots of stress with new job at [employer].  Still having hot flashes.  Denies vaginal bleeding.  Having some vulvar skin irritation.  This is not specifically in the area of the prior VIN 2-3.  Has been on antibiotics.  Denies any vaginal bleeding.  Hasn't really looked at skin but not feeling anything new either.  Having vulvar colposcopy today as well.  Patient's last menstrual period was 07/11/2021 (approximate).          Sexually active: No.  The current method of family planning is post menopausal status.    Smoker:  no  Health Maintenance: Pap:  ordered today History of abnormal Pap:  MMG:  06/10/2022 Colonoscopy:  11/10/2024 Screening Labs: 06/2022   reports that she has never smoked. She has never used smokeless tobacco. She reports that she does not drink alcohol and does not use drugs.  Past Medical History:  Diagnosis Date   Heart murmur    years ago   Hypertension    Scoliosis     Past Surgical History:  Procedure Laterality Date   CESAREAN SECTION     CESAREAN SECTION     2000   COLPOSCOPY N/A 11/23/2021   Procedure: VULVAR COLPOSCOPY;  Surgeon: Jerene Bears, MD;  Location: [facility] OR;  Service: Gynecology;  Laterality: N/A;   DILATION AND CURETTAGE OF UTERUS     miscarriage     2012   VULVA /PERINEUM BIOPSY N/A 11/23/2021   Procedure: WIDE LOCAL EXCISION OF VULVAR VIN;  Surgeon: Jerene Bears, MD;  Location: [facility] OR;  Service: Gynecology;  Laterality: N/A;    Current Outpatient Medications  Medication Sig Dispense Refill   amLODipine (NORVASC) 5 MG tablet Take 1 tablet (5 mg total) by mouth daily. 90 tablet 1   aspirin EC 81 MG tablet Take 81 mg by mouth daily. Swallow whole.     aspirin-acetaminophen-caffeine (EXCEDRIN MIGRAINE) 250-250-65 MG tablet Take 1 tablet by mouth every 6 (six) hours as needed for headache.     azelastine (OPTIVAR) 0.05 % ophthalmic solution Place 1 drop into  both eyes 2 (two) times daily. 6 mL 1   Cholecalciferol (VITAMIN D3 PO) Take 1 capsule by mouth daily.     Cyanocobalamin (B-12 PO) Take 1 tablet by mouth daily.     DM-APAP-CPM (CORICIDIN HBP PO) Take 1 tablet by mouth daily as needed (allergies).     doxycycline (VIBRA-TABS) 100 MG tablet Take 1 tablet (100 mg total) by mouth 2 (two) times daily. 14 tablet 0   ibuprofen (ADVIL) 600 MG tablet Take 1 tablet (600 mg total) by mouth every 6 (six) hours as needed. 30 tablet 0   lidocaine (XYLOCAINE) 5 % ointment Apply 1 Application topically 3 (three) times daily as needed. 30 g 0   MAGNESIUM PO Take 1 capsule by mouth daily.     Multiple Vitamins-Minerals (ZINC PO) Take 1 tablet by mouth daily.     TURMERIC CURCUMIN PO Take 1 capsule by mouth daily.     potassium chloride (KLOR-CON M) 10 MEQ tablet Take 1 tablet (10 mEq total) by mouth daily for 4 days. 4 tablet 0   No current facility-administered medications for this visit.    Family History  Problem Relation Age of Onset   Hypertension Father    Diabetes Father    Cancer Father        prostate   Diabetes Mother  Hypertension Mother     ROS: Constitutional: negative Genitourinary:negative  Exam:   BP (!) 143/95 (BP Location: Right Arm, Patient Position: Sitting, Cuff Size: Normal)   Pulse 70   Ht 5\' 7"  (1.702 m)   Wt 185 lb 3.2 oz (84 kg)   LMP 07/11/2021 (Approximate)   BMI 29.01 kg/m   Height: 5\' 7"  (170.2 cm)  General appearance: alert, cooperative and appears stated age Head: Normocephalic, without obvious abnormality, atraumatic Neck: no adenopathy, supple, symmetrical, trachea midline and thyroid normal to inspection and palpation Lungs: clear to auscultation bilaterally Breasts: normal appearance, no masses or tenderness Heart: regular rate and rhythm Abdomen: soft, non-tender; bowel sounds normal; no masses,  no organomegaly Extremities: extremities normal, atraumatic, no cyanosis or edema Skin: Skin color,  texture, turgor normal. No rashes or lesions Lymph nodes: Cervical, supraclavicular, and axillary nodes normal. No abnormal inguinal nodes palpated Neurologic: Grossly normal   Pelvic: External genitalia:  no lesions              Urethra:  normal appearing urethra with no masses, tenderness or lesions              Bartholins and Skenes: normal                 Vagina: normal appearing vagina with normal color and no discharge, no lesions              Cervix: no lesions              Pap taken: Yes.   Bimanual Exam:  Uterus:  normal size, contour, position, consistency, mobility, non-tender              Adnexa: normal adnexa and no mass, fullness, tenderness               Rectovaginal: Confirms               Anus:  normal sphincter tone, no lesions  Speculum placed.  3% acetic acid applied to vulvar skin on gauze for 3 minutes.  Entire vulvar inspected with colposcope at 7.5X magnification, starting at the anterior portion near the clitoris and working clockwise around the vulva.  Specific attention was paid to around where the prior excision of VIN 2/3 occurred.  No abnormal findings noted.  No skin biopsies obtained.  Procedure ended.  Pt tolerated procedure well.     Chaperone, Raechel Ache, RN, was present during procedure.  Assessment/Plan: 1. Well woman exam with routine gynecological exam - Pap smear obtained today - Mammogram 06/10/2022 - Colonoscopy 11/10/2024 - Bone mineral density not indicated at this time - lab work done with PCP, Ria Clock - vaccines reviewed/updated  2. High risk HPV infection - Cytology - PAP( [place])  3. Cervical cancer screening  - Cytology - PAP( )  4. Vulvar irritation - as this is more generalized with no visible skin changes and recent abx use, will treat with fluconazole and topical triamcinolone.  Pt to call if symptoms persist. - fluconazole (DIFLUCAN) 150 MG tablet; Take 1 tablet (150 mg total) by mouth once for 1 dose.   Dispense: 1 tablet; Refill: 0 - triamcinolone ointment (KENALOG) 0.5 %; Apply twice daily for 7 days and then decrease to nighty for 7 more days.  Dispense: 30 g; Refill: 0  5. Vulvar intraepithelial neoplasia (VIN) grade 2 - colposcopy negative today.   - skin recheck 6 months  6.  Vasomotor symptoms - pt declines any prescription  therapy toda

## 2022-12-13 LAB — CYTOLOGY - PAP
Comment: NEGATIVE
Diagnosis: NEGATIVE
High risk HPV: NEGATIVE

## 2023-01-27 ENCOUNTER — Ambulatory Visit: Payer: Self-pay | Admitting: Family

## 2023-01-27 ENCOUNTER — Ambulatory Visit (INDEPENDENT_AMBULATORY_CARE_PROVIDER_SITE_OTHER): Payer: Self-pay

## 2023-01-27 ENCOUNTER — Ambulatory Visit (HOSPITAL_COMMUNITY)
Admission: EM | Admit: 2023-01-27 | Discharge: 2023-01-27 | Disposition: A | Discharge status: Home / Self Care | Payer: Self-pay | Attending: Family Medicine | Admitting: Family Medicine

## 2023-01-27 ENCOUNTER — Encounter (HOSPITAL_COMMUNITY): Payer: Self-pay

## 2023-01-27 DIAGNOSIS — R062 Wheezing: Secondary | ICD-10-CM

## 2023-01-27 DIAGNOSIS — R051 Acute cough: Secondary | ICD-10-CM

## 2023-01-27 DIAGNOSIS — J4521 Mild intermittent asthma with (acute) exacerbation: Secondary | ICD-10-CM

## 2023-01-27 DIAGNOSIS — J069 Acute upper respiratory infection, unspecified: Secondary | ICD-10-CM

## 2023-01-27 LAB — POC COVID19/FLU A&B COMBO
Covid Antigen, POC: NEGATIVE
Influenza A Antigen, POC: NEGATIVE
Influenza B Antigen, POC: NEGATIVE

## 2023-01-27 MED ORDER — PREDNISONE 20 MG PO TABS: 40.0000 mg | ORAL_TABLET | Freq: Every day | ORAL | 0 refills | Status: AC

## 2023-01-27 MED ORDER — BENZONATATE 100 MG PO CAPS: 100.0000 mg | ORAL_CAPSULE | Freq: Three times a day (TID) | ORAL | 0 refills | Status: AC | PRN

## 2023-01-27 MED ORDER — ALBUTEROL SULFATE HFA 108 (90 BASE) MCG/ACT IN AERS: 2.0000 | INHALATION_SPRAY | RESPIRATORY_TRACT | 0 refills | Status: AC | PRN

## 2023-01-27 NOTE — Telephone Encounter (Signed)
  Chief Complaint: cough Symptoms: cough, congestion, headache, nausea, SOB with activity Frequency: 5 days Pertinent Negatives: Patient denies vomiting, diarrhea, CP Disposition: [] ED /[x] Urgent Care (no appt availability in office) / [] Appointment(In office/virtual)/ []  [facility]/ [] Home Care/ [] Refused Recommended Disposition /[] [facility]/ []  Follow-up with PCP Additional Notes: Pt called stating she has had cough, congestion, SOB with activity, nausea for the last week. States she noticed wheezing at night over the last two days. She reports she has taken OTC medications, mucinex , with no relief. Per protocol, pt to be evaluated within 4 hours. Next available 1/6 at 0820. Pt states she will go to urgent care at [facility]. Care advice reviewed, pt verbalized understanding. Alerting PCP for review.   Copied from CRM [id number]. Topic: Clinical - Red Word Triage >> Jan 27, 2023  3:39 PM Robinson H wrote: [facility] that prompted transfer to Nurse Triage: Patient having, cough pain in chest area, panting a little for breath, a little winded this morning.. Reason for Disposition  Wheezing is present  Answer Assessment - Initial Assessment Questions 1. ONSET: When did the cough begin?      01/22/23 2. SEVERITY: How bad is the cough today?      Pretty constant 3. SPUTUM: Describe the color of your sputum (none, dry cough; clear, white, yellow, green)     Yellow phlegm 4. HEMOPTYSIS: Are you coughing up any blood? If so ask: How much? (flecks, streaks, tablespoons, etc.)     Denies 5. DIFFICULTY BREATHING: Are you having difficulty breathing? If Yes, ask: How bad is it? (e.g., mild, moderate, severe)    - MILD: No SOB at rest, mild SOB with walking, speaks normally in sentences, can lie down, no retractions, pulse < 100.    - MODERATE: SOB at rest, SOB with minimal exertion and prefers to sit, cannot lie down flat, speaks in phrases, mild retractions, audible  wheezing, pulse 100-120.    - SEVERE: Very SOB at rest, speaks in single words, struggling to breathe, sitting hunched forward, retractions, pulse > 120      Mild, with activity up and down the stairs 6. FEVER: Do you have a fever? If Yes, ask: What is your temperature, how was it measured, and when did it start?     Felt as though she did have a fever, but unsure if it was hot flashes 7. CARDIAC HISTORY: Do you have any history of heart disease? (e.g., heart attack, congestive heart failure)      HTN 8. LUNG HISTORY: Do you have any history of lung disease?  (e.g., pulmonary embolus, asthma, emphysema)     Denies 9. PE RISK FACTORS: Do you have a history of blood clots? (or: recent major surgery, recent prolonged travel, bedridden)     Denies 10. OTHER SYMPTOMS: Do you have any other symptoms? (e.g., runny nose, wheezing, chest pain)       Congestion, runny nose, headache, nausea  Protocols used: Cough - Acute Non-Productive-A-AH

## 2023-01-27 NOTE — Discharge Instructions (Addendum)
 Flu and COVID test is negative  Chest x-ray by my review does not show any pneumonia.  I do see your scoliosis. The radiologist will also read your x-ray, and if their interpretation differs significantly from mine, we will call you.  Albuterol  inhaler--do 2 puffs every 4 hours as needed for shortness of breath or wheezing  Take prednisone  20 mg--2 daily for 5 days; this is for inflammation in your lungs, since you heard wheezing  Take benzonatate  100 mg, 1 tab every 8 hours as needed for cough.

## 2023-01-27 NOTE — ED Triage Notes (Signed)
 Pt states cough and congestion with some SOB for the past 5 days. Has been taking Mucinex at home.

## 2023-01-27 NOTE — ED Provider Notes (Addendum)
 [facility]    CSN: [id number] Arrival date & time: 01/27/23  1703      History   Chief Complaint Chief Complaint  Patient presents with   Cough    HPI Sonya Aguilar is a 53 y.o. female.    Cough Here for cough and congestion.  Symptoms began on the evening of December 29.  At first she had a very scratchy throat but that has improved.  She has maybe had some subjective fever.  No vomiting or diarrhea.  She has heard some wheezing at night, and has not ever been diagnosed with asthma.  She is allergic to oxycodone   She is postmenopausal with her last menstrual cycle in June 2023.  Past Medical History:  Diagnosis Date   Heart murmur    years ago   Hypertension    Scoliosis     Patient Active Problem List   Diagnosis Date Noted   Vulvar intraepithelial neoplasia (VIN) grade 3 12/07/2021   Vulvar intraepithelial neoplasia (VIN) grade 2 12/07/2021   Paresthesia 03/01/2021   Chronic idiopathic constipation 11/19/2020   Colon cancer screening 11/19/2020   Abdominal bloating 11/19/2020   Gastroesophageal reflux disease 11/19/2020   Morbid obesity (HCC) 11/19/2020   Periumbilical pain 11/19/2020   History of colonic polyps 11/19/2020   Hot flashes 03/19/2019   Hypertension 12/19/2018   Adenomyosis of uterus 09/11/2018   Bilateral knee pain 10/04/2011    Past Surgical History:  Procedure Laterality Date   CESAREAN SECTION     CESAREAN SECTION     2000   COLPOSCOPY N/A 11/23/2021   Procedure: VULVAR COLPOSCOPY;  Surgeon: Cleotilde Ronal RAMAN, MD;  Location: [facility] OR;  Service: Gynecology;  Laterality: N/A;   DILATION AND CURETTAGE OF UTERUS     miscarriage     2012   VULVA /PERINEUM BIOPSY N/A 11/23/2021   Procedure: WIDE LOCAL EXCISION OF VULVAR VIN;  Surgeon: Cleotilde Ronal RAMAN, MD;  Location: [facility] OR;  Service: Gynecology;  Laterality: N/A;    OB History     Gravida  2   Para  1   Term      Preterm      AB  1   Living  1      SAB  1    IAB      Ectopic      Multiple      Live Births               Home Medications    Prior to Admission medications   Medication Sig Start Date End Date Taking? Authorizing Provider  albuterol  (VENTOLIN  HFA) 108 (90 Base) MCG/ACT inhaler Inhale 2 puffs into the lungs every 4 (four) hours as needed for wheezing or shortness of breath. 01/27/23  Yes Vonna Sharlet POUR, MD  benzonatate  (TESSALON ) 100 MG capsule Take 1 capsule (100 mg total) by mouth 3 (three) times daily as needed for cough. 01/27/23  Yes Vonna Sharlet POUR, MD  predniSONE  (DELTASONE ) 20 MG tablet Take 2 tablets (40 mg total) by mouth daily with breakfast for 5 days. 01/27/23 02/01/23 Yes Lavera Vandermeer, Sharlet POUR, MD  amLODipine  (NORVASC ) 5 MG tablet Take 1 tablet (5 mg total) by mouth daily. 10/26/22   Jason Leita Repine, FNP  aspirin EC 81 MG tablet Take 81 mg by mouth daily. Swallow whole.    [provider]  aspirin-acetaminophen -caffeine (EXCEDRIN MIGRAINE) 250-250-65 MG tablet Take 1 tablet by mouth every 6 (six) hours as needed for headache.  [provider]  azelastine  (OPTIVAR ) 0.05 % ophthalmic solution Place 1 drop into both eyes 2 (two) times daily. 09/30/22   Jason Leita Repine, FNP  Cholecalciferol (VITAMIN D3 PO) Take 1 capsule by mouth daily.    [provider]  Cyanocobalamin  (B-12 PO) Take 1 tablet by mouth daily.    [provider]  DM-APAP-CPM (CORICIDIN HBP PO) Take 1 tablet by mouth daily as needed (allergies).    [provider]  MAGNESIUM PO Take 1 capsule by mouth daily.    [provider]  Multiple Vitamins-Minerals (ZINC PO) Take 1 tablet by mouth daily.    [provider]  triamcinolone  ointment (KENALOG ) 0.5 % Apply twice daily for 7 days and then decrease to nighty for 7 more days. 12/08/22   Cleotilde Ronal RAMAN, MD  TURMERIC CURCUMIN PO Take 1 capsule by mouth daily.    [provider]    Family History Family History  Problem  Relation Age of Onset   Hypertension Father    Diabetes Father    Cancer Father        prostate   Diabetes Mother    Hypertension Mother     Social History Social History   Tobacco Use   Smoking status: Never   Smokeless tobacco: Never  Vaping Use   Vaping status: Never Used  Substance Use Topics   Alcohol use: No    Alcohol/week: 0.0 standard drinks of alcohol   Drug use: No     Allergies   Egg-derived products and Oxycodone    Review of Systems Review of Systems  Respiratory:  Positive for cough.      Physical Exam Triage Vital Signs ED Triage Vitals  Encounter Vitals Group     BP 01/27/23 1815 (!) 142/90     Systolic BP Percentile --      Diastolic BP Percentile --      Pulse Rate 01/27/23 1815 71     Resp 01/27/23 1815 16     Temp 01/27/23 1815 98.6 F (37 C)     Temp Source 01/27/23 1815 Oral     SpO2 01/27/23 1815 95 %     Weight --      Height --      Head Circumference --      Peak Flow --      Pain Score 01/27/23 1817 0     Pain Loc --      Pain Education --      Exclude from Growth Chart --    No data found.  Updated Vital Signs BP (!) 142/90 (BP Location: Right Arm)   Pulse 71   Temp 98.6 F (37 C) (Oral)   Resp 16   LMP 07/11/2021 (Approximate)   SpO2 95%   Visual Acuity Right Eye Distance:   Left Eye Distance:   Bilateral Distance:    Right Eye Near:   Left Eye Near:    Bilateral Near:     Physical Exam Vitals reviewed.  Constitutional:      General: She is not in acute distress.    Appearance: She is not ill-appearing, toxic-appearing or diaphoretic.  HENT:     Right Ear: Tympanic membrane and ear canal normal.     Left Ear: Tympanic membrane and ear canal normal.     Nose: Nose normal. No congestion.     Mouth/Throat:     Mouth: Mucous membranes are moist.     Pharynx: No oropharyngeal exudate or posterior  oropharyngeal erythema.  Eyes:     Extraocular Movements: Extraocular movements intact.      Conjunctiva/sclera: Conjunctivae normal.     Pupils: Pupils are equal, round, and reactive to light.  Cardiovascular:     Rate and Rhythm: Normal rate and regular rhythm.     Heart sounds: No murmur heard. Pulmonary:     Effort: No respiratory distress.     Breath sounds: No stridor. No wheezing, rhonchi or rales.     Comments: Air movement is good the breath sounds are coarse. Musculoskeletal:     Cervical back: Neck supple.  Lymphadenopathy:     Cervical: No cervical adenopathy.  Skin:    Capillary Refill: Capillary refill takes less than 2 seconds.     Coloration: Skin is not jaundiced or pale.  Neurological:     General: No focal deficit present.     Mental Status: She is alert and oriented to person, place, and time.  Psychiatric:        Behavior: Behavior normal.      UC Treatments / Results  Labs (all labs ordered are listed, but only abnormal results are displayed) Labs Reviewed  POC COVID19/FLU A&B COMBO    EKG   Radiology No results found.  Procedures Procedures (including critical care time)  Medications Ordered in UC Medications - No data to display  Initial Impression / Assessment and Plan / UC Course  I have reviewed the triage vital signs and the nursing notes.  Pertinent labs & imaging results that were available during my care of the patient were reviewed by me and considered in my medical decision making (see chart for details).     Chest x-ray by my review is negative for infiltrate.  There is some striking scoliosis noted.  Flu and COVID test is negative  Tessalon  Perles are sent in for cough Final Clinical Impressions(s) / UC Diagnoses   Final diagnoses:  Acute cough  Wheezing  Viral URI with cough  Mild intermittent asthma with exacerbation     Discharge Instructions      Flu and COVID test is negative  Chest x-ray by my review does not show any pneumonia.  I do see your scoliosis. The radiologist will also read your x-ray,  and if their interpretation differs significantly from mine, we will call you.  Albuterol  inhaler--do 2 puffs every 4 hours as needed for shortness of breath or wheezing  Take prednisone  20 mg--2 daily for 5 days; this is for inflammation in your lungs, since you heard wheezing  Take benzonatate  100 mg, 1 tab every 8 hours as needed for cough.        ED Prescriptions     Medication Sig Dispense Auth. Provider   benzonatate  (TESSALON ) 100 MG capsule Take 1 capsule (100 mg total) by mouth 3 (three) times daily as needed for cough. 21 capsule Vonna Sharlet POUR, MD   albuterol  (VENTOLIN  HFA) 108 (90 Base) MCG/ACT inhaler Inhale 2 puffs into the lungs every 4 (four) hours as needed for wheezing or shortness of breath. 1 each Vonna Sharlet POUR, MD   predniSONE  (DELTASONE ) 20 MG tablet Take 2 tablets (40 mg total) by mouth daily with breakfast for 5 days. 10 tablet Vonna Render Marley K, MD      PDMP not reviewed this encounter.   Vonna Sharlet POUR, MD 01/27/23 ALVIE    Vonna Sharlet POUR, MD 01/27/23 1910

## 2023-02-18 ENCOUNTER — Encounter (HOSPITAL_BASED_OUTPATIENT_CLINIC_OR_DEPARTMENT_OTHER): Payer: Self-pay | Admitting: Obstetrics & Gynecology

## 2023-02-22 ENCOUNTER — Ambulatory Visit (INDEPENDENT_AMBULATORY_CARE_PROVIDER_SITE_OTHER): Payer: Self-pay | Admitting: Certified Nurse Midwife

## 2023-02-22 ENCOUNTER — Ambulatory Visit (INDEPENDENT_AMBULATORY_CARE_PROVIDER_SITE_OTHER): Payer: Self-pay

## 2023-02-22 DIAGNOSIS — N950 Postmenopausal bleeding: Secondary | ICD-10-CM | POA: Insufficient documentation

## 2023-02-22 NOTE — Progress Notes (Signed)
   Subjective:     Sonya Aguilar is a 53 y.o. female who presents for evaluation bleeding (possible menstrual period). She is not sexually active and there is no risk of pregnancy. Her most recent annual gyn exam was 11/2022-negative/normal pap smear at that time. Pt is followed by Dr. Hyacinth Meeker for Hx ZOX0-9.   Sonya Aguilar is a 52yo G2P1011 (CS x 1) here for problem gyn visit. Pt started bleeding "like a period" 3 days ago (on Saturday). States that prior to the bleeding she noticed that her breasts felt tender and she noticed some cramping. She then started bleeding "like a period". She is still bleeding but lighter each day and seems to be resolving.  She had not experienced vaginal spotting or bleeding since June 2023 (~18 months prior). In 2023, patient had periods in February and again in May/June 2023.  There was a 12 month period in 2022/2023 in which she did not have a period "for almost a year".   On 09/01/21 her FSH was 66.9 and pt was thought to be postmenopausal. Endometrial biopsy results from 09/13/21: Superficial fragments of benign inactive to atrophic endometrium, Focal stromal breakdown consistent with bleeding, Benign endocervical epithelium and endocervix with squamous metaplasia, Negative for polyp, atypia, hyperplasia and carcinoma.  Pt brought in for [place] today: [place] (02/22/23): Anteverted uterus appears enlarged in size with multiple intramural fibroids largest 2.2 x 2.6cm. Adenomyosis noted on a prior US in 2019. Endometrium ranges from 5-81mm. Appears to be 2 intramural fibroids that displace the endometrium, one inferior, posterior and 1 superior/anterior. Ovaries appear normal bilaterally with perfusion. Neg adnexal regions bilaterally. Negative cul de sac. No free fluid present. Cervix wnl.   The following portions of the patient's history were reviewed and updated as appropriate: allergies, current medications, past family history, past medical history, past social history, past  surgical history, and problem list.   Review of Systems Pertinent items are noted in HPI.    Objective:    LMP 07/11/2021 (Approximate)  General appearance: alert, cooperative, and appears stated age    Assessment:    PMB .  Adenomyosis Intramural fibroids   Plan:    Discussed US findings w/ patient. She was aware of uterine fibroids. Reassured pt that bleeding could represent a menstrual period. Discussed that Dr. Hyacinth Meeker will review US findings and may recommend endometrial biopsy. Pt would prefer not to undergo endometrial biopsy today. Discussed another possible recommendation may be to repeat US in a few months and re-evaluate endometrial lining at that time. Will follow-up with patient once Dr. Hyacinth Meeker reviews ultrasound.  [name]

## 2023-03-01 ENCOUNTER — Ambulatory Visit (HOSPITAL_BASED_OUTPATIENT_CLINIC_OR_DEPARTMENT_OTHER): Payer: Self-pay | Admitting: Certified Nurse Midwife

## 2023-03-01 ENCOUNTER — Other Ambulatory Visit (HOSPITAL_BASED_OUTPATIENT_CLINIC_OR_DEPARTMENT_OTHER): Payer: Self-pay

## 2023-04-09 ENCOUNTER — Emergency Department (HOSPITAL_BASED_OUTPATIENT_CLINIC_OR_DEPARTMENT_OTHER): Payer: Self-pay

## 2023-04-09 ENCOUNTER — Other Ambulatory Visit: Payer: Self-pay

## 2023-04-09 ENCOUNTER — Emergency Department (HOSPITAL_BASED_OUTPATIENT_CLINIC_OR_DEPARTMENT_OTHER)
Admission: EM | Admit: 2023-04-09 | Discharge: 2023-04-10 | Disposition: A | Discharge status: Home / Self Care | Payer: Self-pay | Attending: Emergency Medicine | Admitting: Emergency Medicine

## 2023-04-09 DIAGNOSIS — I10 Essential (primary) hypertension: Secondary | ICD-10-CM | POA: Insufficient documentation

## 2023-04-09 DIAGNOSIS — R0602 Shortness of breath: Secondary | ICD-10-CM | POA: Insufficient documentation

## 2023-04-09 LAB — CBC WITH DIFFERENTIAL/PLATELET
Abs Immature Granulocytes: 0.02 10*3/uL (ref 0.00–0.07)
Basophils Absolute: 0 10*3/uL (ref 0.0–0.1)
Basophils Relative: 0 %
Eosinophils Absolute: 0.1 10*3/uL (ref 0.0–0.5)
Eosinophils Relative: 2 %
HCT: 36.5 % (ref 36.0–46.0)
Hemoglobin: 11.6 g/dL — ABNORMAL LOW (ref 12.0–15.0)
Immature Granulocytes: 0 %
Lymphocytes Relative: 46 %
Lymphs Abs: 3.1 10*3/uL (ref 0.7–4.0)
MCH: 26.5 pg (ref 26.0–34.0)
MCHC: 31.8 g/dL (ref 30.0–36.0)
MCV: 83.5 fL (ref 80.0–100.0)
Monocytes Absolute: 0.5 10*3/uL (ref 0.1–1.0)
Monocytes Relative: 7 %
Neutro Abs: 3 10*3/uL (ref 1.7–7.7)
Neutrophils Relative %: 45 %
Platelets: 290 10*3/uL (ref 150–400)
RBC: 4.37 MIL/uL (ref 3.87–5.11)
RDW: 15.1 % (ref 11.5–15.5)
WBC: 6.8 10*3/uL (ref 4.0–10.5)
nRBC: 0 % (ref 0.0–0.2)

## 2023-04-09 LAB — BASIC METABOLIC PANEL
Anion gap: 9 (ref 5–15)
BUN: 13 mg/dL (ref 6–20)
CO2: 27 mmol/L (ref 22–32)
Calcium: 9 mg/dL (ref 8.9–10.3)
Chloride: 103 mmol/L (ref 98–111)
Creatinine, Ser: 0.63 mg/dL (ref 0.44–1.00)
GFR, Estimated: >60 mL/min (ref >60)
Glucose, Bld: 98 mg/dL (ref 70–99)
Potassium: 3.6 mmol/L (ref 3.5–5.1)
Sodium: 139 mmol/L (ref 135–145)

## 2023-04-09 MED ORDER — ALBUTEROL SULFATE HFA 108 (90 BASE) MCG/ACT IN AERS
2.0000 | INHALATION_SPRAY | RESPIRATORY_TRACT | Status: DC | PRN
  Administered 2023-04-09: 2 via RESPIRATORY_TRACT
  Filled 2023-04-09: qty 6.7

## 2023-04-09 NOTE — ED Provider Notes (Incomplete)
 [place] EMERGENCY DEPARTMENT AT [facility] Provider Note   CSN: [id number] Arrival date & time: 04/09/23  2126     History {Add pertinent medical, surgical, social history, OB history to HPI:1} Chief Complaint  Patient presents with  . Shortness of Breath    Sonya Aguilar is a 53 y.o. female past medical history significant for hypertension, paresthesias and obesity presents today for shortness of breath and bilateral tingling in hands that began last night.   Shortness of Breath      Home Medications Prior to Admission medications   Medication Sig Start Date End Date Taking? Authorizing Provider  albuterol (VENTOLIN HFA) 108 (90 Base) MCG/ACT inhaler Inhale 2 puffs into the lungs every 4 (four) hours as needed for wheezing or shortness of breath. 01/27/23   Zenia Resides, MD  amLODipine (NORVASC) 5 MG tablet Take 1 tablet (5 mg total) by mouth daily. 10/26/22   Olive Bass, FNP  aspirin EC 81 MG tablet Take 81 mg by mouth daily. Swallow whole.    [provider]  aspirin-acetaminophen-caffeine (EXCEDRIN MIGRAINE) [phone] MG tablet Take 1 tablet by mouth every 6 (six) hours as needed for headache.    [provider]  azelastine (OPTIVAR) 0.05 % ophthalmic solution Place 1 drop into both eyes 2 (two) times daily. 09/30/22   Olive Bass, FNP  benzonatate (TESSALON) 100 MG capsule Take 1 capsule (100 mg total) by mouth 3 (three) times daily as needed for cough. 01/27/23   Zenia Resides, MD  Cholecalciferol (VITAMIN D3 PO) Take 1 capsule by mouth daily.    [provider]  Cyanocobalamin (B-12 PO) Take 1 tablet by mouth daily.    [provider]  DM-APAP-CPM (CORICIDIN HBP PO) Take 1 tablet by mouth daily as needed (allergies).    [provider]  MAGNESIUM PO Take 1 capsule by mouth daily.    [provider]  Multiple Vitamins-Minerals (ZINC PO) Take 1 tablet by mouth daily.     [provider]  triamcinolone ointment (KENALOG) 0.5 % Apply twice daily for 7 days and then decrease to nighty for 7 more days. 12/08/22   Jerene Bears, MD  TURMERIC CURCUMIN PO Take 1 capsule by mouth daily.    [provider]      Allergies    Egg-derived products and Oxycodone    Review of Systems   Review of Systems  Respiratory:  Positive for shortness of breath.   Neurological:  Positive for numbness.    Physical Exam Updated Vital Signs BP (!) 159/90   Pulse 75   Temp 98.2 F (36.8 C)   Resp (!) 22   Ht 5\' 7"  (1.702 m)   Wt 77.1 kg   LMP 07/11/2021 (Approximate)   SpO2 100%   BMI 26.63 kg/m  Physical Exam Vitals and nursing note reviewed.  Constitutional:      General: She is not in acute distress.    Appearance: She is well-developed.  HENT:     Head: Normocephalic and atraumatic.  Eyes:     Conjunctiva/sclera: Conjunctivae normal.  Cardiovascular:     Rate and Rhythm: Normal rate and regular rhythm.     Heart sounds: No murmur heard. Pulmonary:     Effort: Pulmonary effort is normal. No respiratory distress.     Breath sounds: Normal breath sounds.  Abdominal:     Palpations: Abdomen is soft.     Tenderness: There is no abdominal tenderness.  Musculoskeletal:  General: No swelling.     Cervical back: Neck supple.  Skin:    General: Skin is warm and dry.     Capillary Refill: Capillary refill takes less than 2 seconds.  Neurological:     Mental Status: She is alert.  Psychiatric:        Mood and Affect: Mood normal.     ED Results / Procedures / Treatments   Labs (all labs ordered are listed, but only abnormal results are displayed) Labs Reviewed  CBC WITH DIFFERENTIAL/PLATELET - Abnormal; Notable for the following components:      Result Value   Hemoglobin 11.6 (*)    All other components within normal limits  URINALYSIS, ROUTINE W REFLEX MICROSCOPIC  BASIC METABOLIC PANEL    EKG None  Radiology DG Chest  Port 1 View Result Date: 04/09/2023 CLINICAL DATA:  Shortness of breath and tingling in hands. EXAM: PORTABLE CHEST 1 VIEW COMPARISON:  01/27/2023 FINDINGS: Stable cardiomediastinal silhouette. No focal consolidation, pleural effusion, or pneumothorax. No displaced rib fractures. Scoliosis. IMPRESSION: No active disease. Electronically Signed   By: Minerva Fester M.D.   On: 04/09/2023 22:13    Procedures Procedures  {Document cardiac monitor, telemetry assessment procedure when appropriate:1}  Medications Ordered in ED Medications  albuterol (VENTOLIN HFA) 108 (90 Base) MCG/ACT inhaler 2 puff (2 puffs Inhalation Given 04/09/23 2217)    ED Course/ Medical Decision Making/ A&P   {   Click here for ABCD2, HEART and other calculatorsREFRESH Note before signing :1}                              Medical Decision Making Amount and/or Complexity of Data Reviewed Labs: ordered. Radiology: ordered.  Risk Prescription drug management.   This patient presents to the ED with chief complaint(s) of shortness of breath and tingling in hands with pertinent past medical history of hypertension which further complicates the presenting complaint. The complaint involves an extensive differential diagnosis and also carries with it a high risk of complications and morbidity.    The differential diagnosis includes hypertensive emergency, hypertension, anemia, pneumothorax, pneumonia  Additional history obtained: Records reviewed Primary Care Documents  ED Course and Reassessment:   Independent labs interpretation:  The following labs were independently interpreted:  CBC: Anemia which is chronic per historical values BMP: UA: EKG: Normal sinus rhythm, old septal infarct  Independent visualization of imaging: - I independently visualized the following imaging with scope of interpretation limited to determining acute life threatening conditions related to emergency care: Chest x-ray, which revealed  no active disease  Consultation: - Consulted or discussed management/test interpretation w/ external professional: ***  Consideration for admission or further workup: *** Social Determinants of health: ***   {Document critical care time when appropriate:1} {Document review of labs and clinical decision tools ie heart score, Chads2Vasc2 etc:1}  {Document your independent review of radiology images, and any outside records:1} {Document your discussion with family members, caretakers, and with consultants:1} {Document social determinants of health affecting pt's care:1} {Document your decision making why or why not admission, treatments were needed:1} Final Clinical Impression(s) / ED Diagnoses Final diagnoses:  None    Rx / DC Orders ED Discharge Orders     None

## 2023-04-09 NOTE — ED Triage Notes (Signed)
 Pt POV from home reporting SOB and bilateral tingling in hands since last night. BP 200/106 in triage, takes amlodipine as prescribed.

## 2023-04-10 LAB — D-DIMER, QUANTITATIVE: D-Dimer, Quant: 0.53 ug{FEU}/mL — ABNORMAL HIGH (ref 0.00–0.50)

## 2023-04-10 LAB — TROPONIN I (HIGH SENSITIVITY): Troponin I (High Sensitivity): 5 ng/L (ref <18)

## 2023-04-10 NOTE — ED Provider Notes (Signed)
 [facility] Emergency Department Provider Note MRN:  [id number]  Arrival date & time: 04/10/23     Chief Complaint   Shortness of Breath   History of Present Illness   Sonya Aguilar is a 53 y.o. year-old female with a history of hypertension presenting to the ED with chief complaint of shortness of breath.  Intermittent chest pain for the past few months.  For the past 2 days having shortness of breath.  Some leg swelling bilaterally that gets worse during the day when she is on her feet, improves when she lays flat.  Denies fever or cough, no abdominal pain, no passing out spells, some occasional lightheadedness.  Review of Systems  A thorough review of systems was obtained and all systems are negative except as noted in the HPI and PMH.   Patient's Health History    Past Medical History:  Diagnosis Date   Heart murmur    years ago   Hypertension    Scoliosis     Past Surgical History:  Procedure Laterality Date   CESAREAN SECTION     CESAREAN SECTION     2000   COLPOSCOPY N/A 11/23/2021   Procedure: VULVAR COLPOSCOPY;  Surgeon: Jerene Bears, MD;  Location: [facility] OR;  Service: Gynecology;  Laterality: N/A;   DILATION AND CURETTAGE OF UTERUS     miscarriage     2012   VULVA /PERINEUM BIOPSY N/A 11/23/2021   Procedure: WIDE LOCAL EXCISION OF VULVAR VIN;  Surgeon: Jerene Bears, MD;  Location: [facility] OR;  Service: Gynecology;  Laterality: N/A;    Family History  Problem Relation Age of Onset   Hypertension Father    Diabetes Father    Cancer Father        prostate   Diabetes Mother    Hypertension Mother     Social History   Socioeconomic History   Marital status: Divorced    Spouse name: Not on file   Number of children: Not on file   Years of education: Not on file   Highest education level: Not on file  Occupational History   Not on file  Tobacco Use   Smoking status: Never   Smokeless tobacco: Never  Vaping Use   Vaping status:  Never Used  Substance and Sexual Activity   Alcohol use: No    Alcohol/week: 0.0 standard drinks of alcohol   Drug use: No   Sexual activity: Not Currently    Birth control/protection: None  Other Topics Concern   Not on file  Social History Narrative   Not on file   Social Drivers of Health   Financial Resource Strain: Not on file  Food Insecurity: Not on file  Transportation Needs: Not on file  Physical Activity: Not on file  Stress: Not on file  Social Connections: Not on file  Intimate Partner Violence: Not on file     Physical Exam   Vitals:   04/10/23 0045 04/10/23 0100  BP: (!) 148/89 (!) 144/81  Pulse: 67 67  Resp:    Temp:    SpO2: 96% 100%    CONSTITUTIONAL: Well-appearing, NAD NEURO/PSYCH:  Alert and oriented x 3, no focal deficits EYES:  eyes equal and reactive ENT/NECK:  no LAD, no JVD CARDIO: Regular rate, well-perfused, normal S1 and S2 PULM:  CTAB no wheezing or rhonchi GI/GU:  non-distended, non-tender MSK/SPINE:  No gross deformities, no edema SKIN:  no rash, atraumatic   *Additional and/or pertinent findings included  in MDM below  Diagnostic and Interventional Summary    EKG Interpretation Date/Time:  Sunday April 09 2023 21:37:55 EDT Ventricular Rate:  76 PR Interval:  170 QRS Duration:  84 QT Interval:  382 QTC Calculation: 429 R Axis:   73  Text Interpretation: Normal sinus rhythm Septal infarct (cited on or before 17-Apr-2017) Abnormal ECG When compared with ECG of 15-Nov-2021 15:20, Questionable change in initial forces of Septal leads T wave inversion no longer evident in Inferior leads Nonspecific T wave abnormality, improved in Lateral leads Confirmed by Elinore Shults ([id number]) on 04/10/2023 12:39:15 AM       Labs Reviewed  CBC WITH DIFFERENTIAL/PLATELET - Abnormal; Notable for the following components:      Result Value   Hemoglobin 11.6 (*)    All other components within normal limits  D-DIMER, QUANTITATIVE - Abnormal;  Notable for the following components:   D-Dimer, Quant 0.53 (*)    All other components within normal limits  BASIC METABOLIC PANEL  URINALYSIS, ROUTINE W REFLEX MICROSCOPIC  TROPONIN I (HIGH SENSITIVITY)    DG Chest Port 1 View  Final Result      Medications  albuterol (VENTOLIN HFA) 108 (90 Base) MCG/ACT inhaler 2 puff (2 puffs Inhalation Given 04/09/23 2217)     Procedures  /  Critical Care Procedures  ED Course and Medical Decision Making  Initial Impression and Ddx Differential diagnosis includes ACS, PE, CHF, stress.  Patient is without tachycardia or hypoxia, sitting comfortably at this time with reassuring vital signs.  PE felt to be a low risk diagnosis, obtaining D-dimer.  Past medical/surgical history that increases complexity of ED encounter: Hypertension  Interpretation of Diagnostics I personally reviewed the EKG and my interpretation is as follows: Sinus rhythm without concerning ischemic changes  No significant blood count or electrolyte disturbance.  Troponin negative, D-dimer negative when age-adjusted.  Patient Reassessment and Ultimate Disposition/Management     Patient continues to look and feel well on reassessment with normal vital signs, no increased work of breathing, resting comfortably.  Appropriate for discharge with follow-up.  Patient management required discussion with the following services or consulting groups:  None  Complexity of Problems Addressed Acute illness or injury that poses threat of life of bodily function  Additional Data Reviewed and Analyzed Further history obtained from: Further history from spouse/family member  Additional Factors Impacting ED Encounter Risk Consideration of hospitalization  Deloris Mittag M. Kevan Prouty, MD [facility] [email or web address]  Final Clinical Impressions(s) / ED Diagnoses     ICD-10-CM   1. Shortness of breath  R06.02 Ambulatory referral to Cardiology       ED Discharge Orders          Ordered    Ambulatory referral to Cardiology        03 /17/25 0219             Discharge Instructions Discussed with and Provided to Patient:     Discharge Instructions      You were evaluated in the Emergency Department and after careful evaluation, we did not find any emergent condition requiring admission or further testing in the hospital.  Your exam/testing today is overall reassuring.  Recommend follow-up with your primary care doctor as well as cardiology to discuss your symptoms.  Please return to the Emergency Department if you experience any worsening of your condition.   Thank you for allowing [place] to be a part of your care.  Sabas Sous, MD 04/10/23 [phone]

## 2023-04-10 NOTE — Discharge Instructions (Signed)
 You were evaluated in the Emergency Department and after careful evaluation, we did not find any emergent condition requiring admission or further testing in the hospital.  Your exam/testing today is overall reassuring.  Recommend follow-up with your primary care doctor as well as cardiology to discuss your symptoms.  Please return to the Emergency Department if you experience any worsening of your condition.   Thank you for allowing [place] to be a part of your care.

## 2023-04-10 NOTE — ED Notes (Signed)
 RN reviewed discharge instructions with pt. Pt verbalized understanding and had no further questions. VSS upon discharge.

## 2023-11-10 ENCOUNTER — Other Ambulatory Visit: Payer: Self-pay

## 2023-11-10 ENCOUNTER — Encounter (HOSPITAL_BASED_OUTPATIENT_CLINIC_OR_DEPARTMENT_OTHER): Payer: Self-pay

## 2023-11-10 DIAGNOSIS — Z79899 Other long term (current) drug therapy: Secondary | ICD-10-CM | POA: Insufficient documentation

## 2023-11-10 DIAGNOSIS — Z7982 Long term (current) use of aspirin: Secondary | ICD-10-CM | POA: Insufficient documentation

## 2023-11-10 DIAGNOSIS — R519 Headache, unspecified: Secondary | ICD-10-CM | POA: Insufficient documentation

## 2023-11-10 DIAGNOSIS — I10 Essential (primary) hypertension: Secondary | ICD-10-CM | POA: Insufficient documentation

## 2023-11-10 LAB — COMPREHENSIVE METABOLIC PANEL WITH GFR
ALT: 7 U/L (ref 0–44)
AST: 10 U/L — ABNORMAL LOW (ref 15–41)
Albumin: 4.2 g/dL (ref 3.5–5.0)
Alkaline Phosphatase: 111 U/L (ref 38–126)
Anion gap: 9 (ref 5–15)
BUN: 14 mg/dL (ref 6–20)
CO2: 25 mmol/L (ref 22–32)
Calcium: 9 mg/dL (ref 8.9–10.3)
Chloride: 104 mmol/L (ref 98–111)
Creatinine, Ser: 0.68 mg/dL (ref 0.44–1.00)
GFR, Estimated: >60 mL/min (ref >60)
Glucose, Bld: 103 mg/dL — ABNORMAL HIGH (ref 70–99)
Potassium: 3.9 mmol/L (ref 3.5–5.1)
Sodium: 138 mmol/L (ref 135–145)
Total Bilirubin: 0.2 mg/dL (ref 0.0–1.2)
Total Protein: 7.8 g/dL (ref 6.5–8.1)

## 2023-11-10 LAB — CBC WITH DIFFERENTIAL/PLATELET
Abs Immature Granulocytes: 0.01 K/uL (ref 0.00–0.07)
Basophils Absolute: 0 K/uL (ref 0.0–0.1)
Basophils Relative: 1 %
Eosinophils Absolute: 0.1 K/uL (ref 0.0–0.5)
Eosinophils Relative: 2 %
HCT: 36.9 % (ref 36.0–46.0)
Hemoglobin: 11.8 g/dL — ABNORMAL LOW (ref 12.0–15.0)
Immature Granulocytes: 0 %
Lymphocytes Relative: 45 %
Lymphs Abs: 3.1 K/uL (ref 0.7–4.0)
MCH: 26.3 pg (ref 26.0–34.0)
MCHC: 32 g/dL (ref 30.0–36.0)
MCV: 82.2 fL (ref 80.0–100.0)
Monocytes Absolute: 0.5 K/uL (ref 0.1–1.0)
Monocytes Relative: 8 %
Neutro Abs: 3 K/uL (ref 1.7–7.7)
Neutrophils Relative %: 44 %
Platelets: 298 K/uL (ref 150–400)
RBC: 4.49 MIL/uL (ref 3.87–5.11)
RDW: 14.6 % (ref 11.5–15.5)
WBC: 6.7 K/uL (ref 4.0–10.5)
nRBC: 0 % (ref 0.0–0.2)

## 2023-11-10 NOTE — ED Triage Notes (Signed)
 Pt c/o HA since the 27th of sept intermittent; I can take something & it goes away but now it tingles in my face & jaw/ neck. Associated nausea, some blurred vision intermittent w HA

## 2023-11-11 ENCOUNTER — Emergency Department (HOSPITAL_BASED_OUTPATIENT_CLINIC_OR_DEPARTMENT_OTHER)
Admission: EM | Admit: 2023-11-11 | Discharge: 2023-11-11 | Disposition: A | Payer: Self-pay | Attending: Emergency Medicine | Admitting: Emergency Medicine

## 2023-11-11 ENCOUNTER — Emergency Department (HOSPITAL_BASED_OUTPATIENT_CLINIC_OR_DEPARTMENT_OTHER): Payer: Self-pay

## 2023-11-11 DIAGNOSIS — R519 Headache, unspecified: Secondary | ICD-10-CM

## 2023-11-11 MED ORDER — BUTALBITAL-APAP-CAFFEINE 50-325-40 MG PO TABS: 1.0000 | ORAL_TABLET | Freq: Four times a day (QID) | ORAL | 0 refills | Status: AC | PRN

## 2023-11-11 NOTE — ED Provider Notes (Signed)
 [place] EMERGENCY DEPARTMENT AT [facility] Provider Note   CSN: [id number] Arrival date & time: 11/10/23  2240     Patient presents with: Headache   Sonya Aguilar is a 53 y.o. female.   53 year old female currently going through menopause with a history of hypertension who presents ER today with headache.  Patient states has been going on for a few weeks.  It is intermittent and random in nature.  No particular time of the day or activities exacerbate it.  Nothing really seems to make it go away although over-the-counter medications do improve it some.  She has had intermittent episodes of left cheek paresthesias and left hand paresthesias.  Her daughter is a Engineer, civil (consulting) who recommended get checked out to rule out stroke.  Patient states that a few days ago her eyes twitch uncontrollably.  She does have a history of Bell's palsy and she notes some facial asymmetry related to that but that is unchanged.  Has not noticed any numbness or weakness anywhere.  No trauma.  No fever, recent illnesses.  She states she does not sleep very well but that is chronic for her and actually feels like she is sleeping better than previously at this time.   Headache      Prior to Admission medications   Medication Sig Start Date End Date Taking? Authorizing Provider  butalbital-acetaminophen -caffeine (FIORICET) 50-325-40 MG tablet Take 1-2 tablets by mouth every 6 (six) hours as needed for headache. 11/11/23 11/10/24 Yes Amias Hutchinson, Selinda, MD  albuterol  (VENTOLIN  HFA) 108 (90 Base) MCG/ACT inhaler Inhale 2 puffs into the lungs every 4 (four) hours as needed for wheezing or shortness of breath. 01/27/23   Vonna Sharlet POUR, MD  amLODipine  (NORVASC ) 5 MG tablet Take 1 tablet (5 mg total) by mouth daily. 10/26/22   Raynie Steinhaus Leita Repine, FNP  aspirin EC 81 MG tablet Take 81 mg by mouth daily. Swallow whole.    [provider]  aspirin-acetaminophen -caffeine (EXCEDRIN MIGRAINE) 250-250-65 MG  tablet Take 1 tablet by mouth every 6 (six) hours as needed for headache.    [provider]  azelastine  (OPTIVAR ) 0.05 % ophthalmic solution Place 1 drop into both eyes 2 (two) times daily. 09/30/22   Trinitey Roache Leita Repine, FNP  benzonatate  (TESSALON ) 100 MG capsule Take 1 capsule (100 mg total) by mouth 3 (three) times daily as needed for cough. 01/27/23   Banister, Pamela K, MD  Cholecalciferol (VITAMIN D3 PO) Take 1 capsule by mouth daily.    [provider]  Cyanocobalamin  (B-12 PO) Take 1 tablet by mouth daily.    [provider]  DM-APAP-CPM (CORICIDIN HBP PO) Take 1 tablet by mouth daily as needed (allergies).    [provider]  MAGNESIUM PO Take 1 capsule by mouth daily.    [provider]  Multiple Vitamins-Minerals (ZINC PO) Take 1 tablet by mouth daily.    [provider]  triamcinolone  ointment (KENALOG ) 0.5 % Apply twice daily for 7 days and then decrease to nighty for 7 more days. 12/08/22   Cleotilde Ronal RAMAN, MD  TURMERIC CURCUMIN PO Take 1 capsule by mouth daily.    [provider]    Allergies: Egg protein-containing drug products and Oxycodone     Review of Systems  Neurological:  Positive for headaches.    Updated Vital Signs BP 129/78 (BP Location: Right Arm)   Pulse (!) 59   Temp 98 F (36.7 C) (Oral)   Resp 17   LMP 07/11/2021 (Approximate)  SpO2 100%   Physical Exam Vitals and nursing note reviewed.  Constitutional:      Appearance: She is well-developed.  HENT:     Head: Normocephalic and atraumatic.  Cardiovascular:     Rate and Rhythm: Normal rate and regular rhythm.  Pulmonary:     Effort: No respiratory distress.     Breath sounds: No stridor.  Abdominal:     General: There is no distension.  Musculoskeletal:     Cervical back: Normal range of motion.  Neurological:     Mental Status: She is alert.     Comments: No altered mental status, able to give full seemingly accurate history.   Face is symmetric, EOM's intact, pupils equal and reactive, vision intact, tongue and uvula midline without deviation. Upper and Lower extremity motor 5/5, intact pain perception in distal extremities, 2+ reflexes in biceps, patella and achilles tendons. Able to perform finger to nose normal with both hands. Walks without assistance or evident ataxia.       (all labs ordered are listed, but only abnormal results are displayed) Labs Reviewed  COMPREHENSIVE METABOLIC PANEL WITH GFR - Abnormal; Notable for the following components:      Result Value   Glucose, Bld 103 (*)    AST 10 (*)    All other components within normal limits  CBC WITH DIFFERENTIAL/PLATELET - Abnormal; Notable for the following components:   Hemoglobin 11.8 (*)    All other components within normal limits    EKG: None  Radiology: CT Head Wo Contrast Result Date: 11/11/2023 EXAM: CT HEAD WITHOUT CONTRAST 11/11/2023 01:50:55 AM TECHNIQUE: CT of the head was performed without the administration of intravenous contrast. Automated exposure control, iterative reconstruction, and/or weight based adjustment of the mA/kV was utilized to reduce the radiation dose to as low as reasonably achievable. COMPARISON: CT and MRI head 12/27/2013. CLINICAL HISTORY: Headache, increasing frequency or severity. HA since the 27th of sept intermittent. FINDINGS: BRAIN AND VENTRICLES: No acute hemorrhage. No evidence of acute infarct. No hydrocephalus. No extra-axial collection. No mass effect or midline shift. ORBITS: No acute abnormality. SINUSES: No acute abnormality. SOFT TISSUES AND SKULL: No acute soft tissue abnormality. No skull fracture. IMPRESSION: 1. No acute intracranial abnormality. Electronically signed by: Norman Gatlin MD 11/11/2023 01:56 AM EDT RP Workstation: [id number]     Procedures   Medications Ordered in the ED - No data to display                                  Medical Decision Making Amount and/or Complexity  of Data Reviewed Labs: ordered. Radiology: ordered.  Risk Prescription drug management.   Secondary to prolonged headache that is new for the patient a CT scan was done to rule out any evidence of subacute infarct, mass or other abnormalities.  This was viewed interpreted by myself without any obvious abnormalities, radiology read reviewed.  Offered headache cocktail but patient drove here so we will send in a prescription for Fioricet and a neurology referral if not improving.  Final diagnoses:  Nonintractable headache, unspecified chronicity pattern, unspecified headache type    ED Discharge Orders          Ordered    butalbital-acetaminophen -caffeine (FIORICET) 50-325-40 MG tablet  Every 6 hours PRN        11/11/23 0300    Ambulatory referral to Neurology       Comments: An appointment is requested  in approximately: 2 weeks   11/11/23 0300               Jacorion Klem, Selinda, MD 11/11/23 [phone]

## 2024-03-28 ENCOUNTER — Encounter: Payer: Self-pay | Admitting: Family Medicine

## 2024-07-22 ENCOUNTER — Ambulatory Visit (HOSPITAL_BASED_OUTPATIENT_CLINIC_OR_DEPARTMENT_OTHER): Payer: Self-pay | Admitting: Obstetrics & Gynecology
